# Patient Record
Sex: Female | Born: 1980 | Race: Black or African American | Hispanic: No | Marital: Single | Smoking: Current every day smoker
Health system: Southern US, Community
[De-identification: ages and names within clinical notes are randomized; demographics above are authoritative.]

## PROBLEM LIST (undated history)

## (undated) DIAGNOSIS — D219 Benign neoplasm of connective and other soft tissue, unspecified: Secondary | ICD-10-CM

## (undated) DIAGNOSIS — N939 Abnormal uterine and vaginal bleeding, unspecified: Secondary | ICD-10-CM

## (undated) DIAGNOSIS — IMO0002 Reserved for concepts with insufficient information to code with codable children: Secondary | ICD-10-CM

## (undated) DIAGNOSIS — Z87448 Personal history of other diseases of urinary system: Secondary | ICD-10-CM

## (undated) DIAGNOSIS — R87619 Unspecified abnormal cytological findings in specimens from cervix uteri: Secondary | ICD-10-CM

## (undated) DIAGNOSIS — J45909 Unspecified asthma, uncomplicated: Secondary | ICD-10-CM

## (undated) HISTORY — DX: Unspecified abnormal cytological findings in specimens from cervix uteri: R87.619

## (undated) HISTORY — DX: Personal history of other diseases of urinary system: Z87.448

## (undated) HISTORY — DX: Unspecified asthma, uncomplicated: J45.909

## (undated) HISTORY — DX: Reserved for concepts with insufficient information to code with codable children: IMO0002

---

## 1999-08-16 ENCOUNTER — Other Ambulatory Visit: Admission: RE | Admit: 1999-08-16 | Discharge: 1999-08-16 | Payer: Self-pay | Admitting: Obstetrics and Gynecology

## 1999-08-16 ENCOUNTER — Encounter (INDEPENDENT_AMBULATORY_CARE_PROVIDER_SITE_OTHER): Payer: Self-pay

## 1999-09-06 ENCOUNTER — Emergency Department (HOSPITAL_COMMUNITY): Admission: EM | Admit: 1999-09-06 | Discharge: 1999-09-06 | Payer: Self-pay | Admitting: Emergency Medicine

## 2000-09-27 ENCOUNTER — Encounter (INDEPENDENT_AMBULATORY_CARE_PROVIDER_SITE_OTHER): Payer: Self-pay

## 2000-09-27 ENCOUNTER — Other Ambulatory Visit: Admission: RE | Admit: 2000-09-27 | Discharge: 2000-09-27 | Payer: Self-pay | Admitting: Obstetrics

## 2000-11-26 ENCOUNTER — Encounter: Admission: RE | Admit: 2000-11-26 | Discharge: 2000-11-26 | Payer: Self-pay | Admitting: Obstetrics & Gynecology

## 2002-06-25 HISTORY — PX: CRYOTHERAPY: SHX1416

## 2002-06-25 HISTORY — PX: COLPOSCOPY: SHX161

## 2003-08-20 ENCOUNTER — Encounter: Admission: RE | Admit: 2003-08-20 | Discharge: 2003-08-20 | Payer: Self-pay | Admitting: Family Medicine

## 2003-12-14 ENCOUNTER — Encounter: Admission: RE | Admit: 2003-12-14 | Discharge: 2003-12-14 | Payer: Self-pay | Admitting: Obstetrics and Gynecology

## 2004-10-18 ENCOUNTER — Emergency Department (HOSPITAL_COMMUNITY): Admission: EM | Admit: 2004-10-18 | Discharge: 2004-10-18 | Payer: Self-pay | Admitting: Emergency Medicine

## 2005-07-17 ENCOUNTER — Other Ambulatory Visit: Admission: RE | Admit: 2005-07-17 | Discharge: 2005-07-17 | Payer: Self-pay | Admitting: Obstetrics & Gynecology

## 2006-02-24 IMAGING — CR DG HAND COMPLETE 3+V*R*
3 series · 3 of 3 positions shown · non-contrast
Comparison: none

CLINICAL DATA: Laceration.  
 3-VIEW RIGHT HAND:

[view not recorded (1 of 3)]
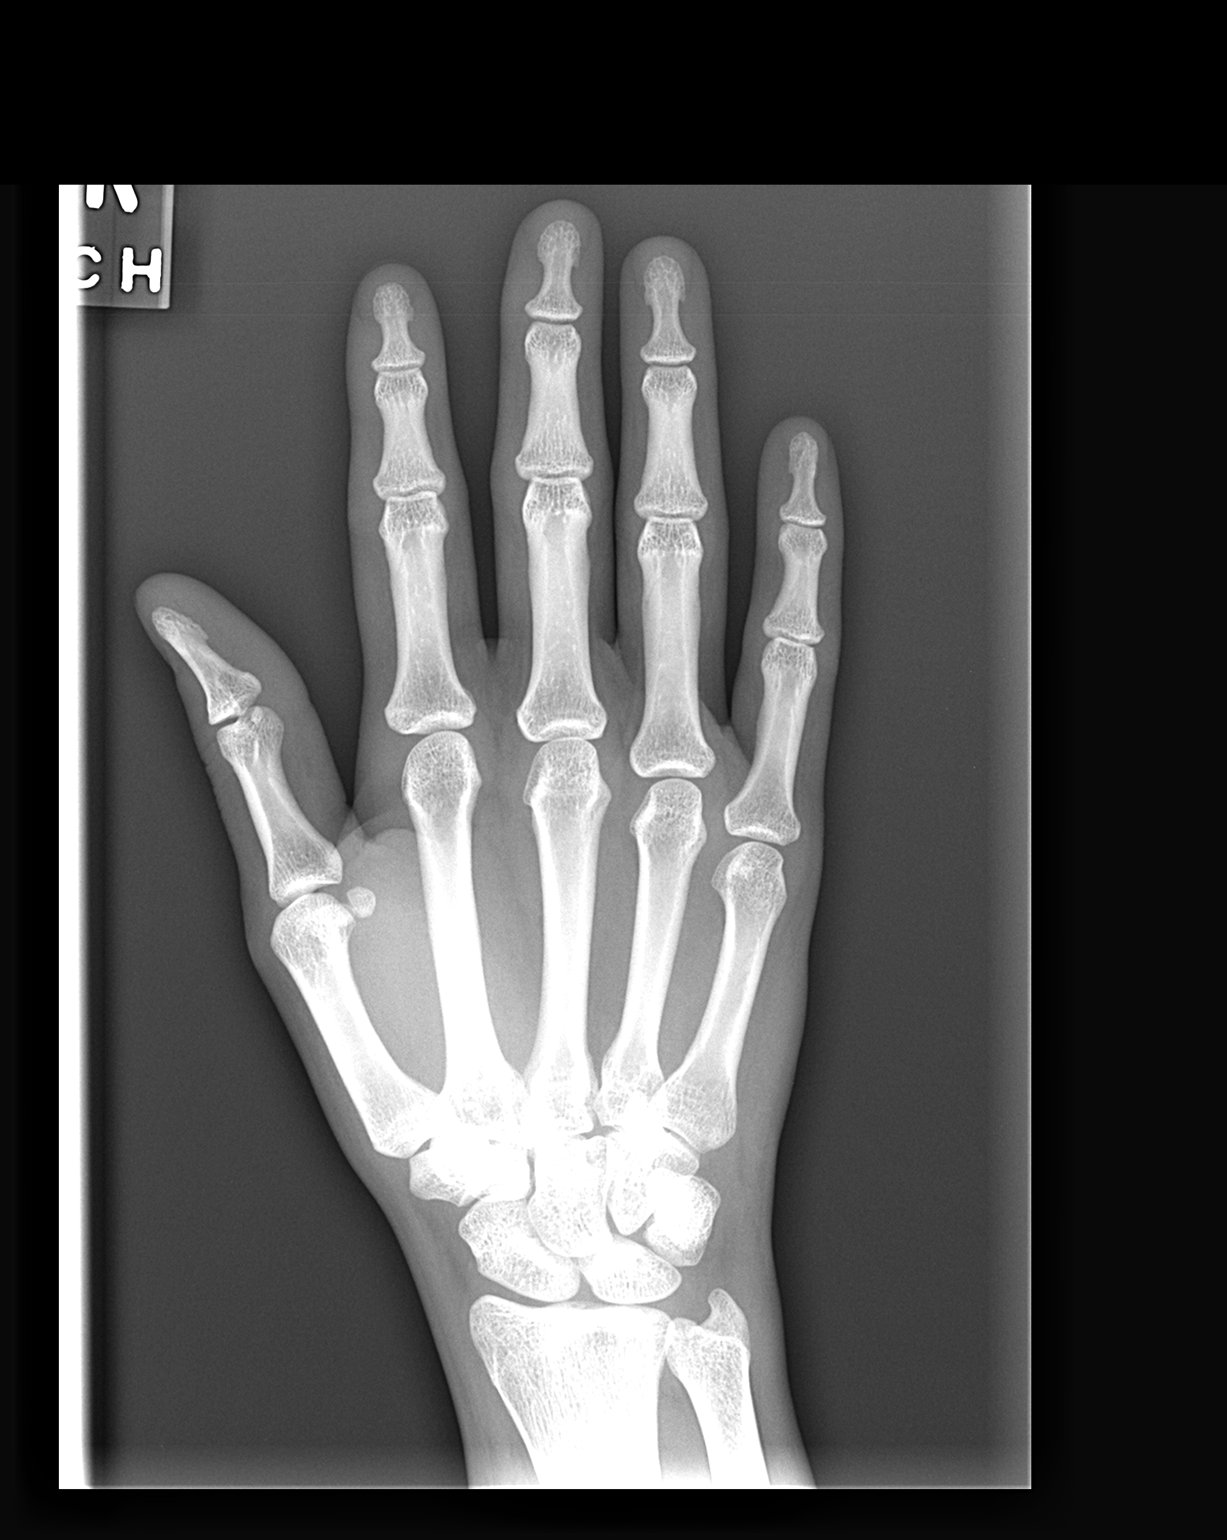

[view not recorded (2 of 3)]
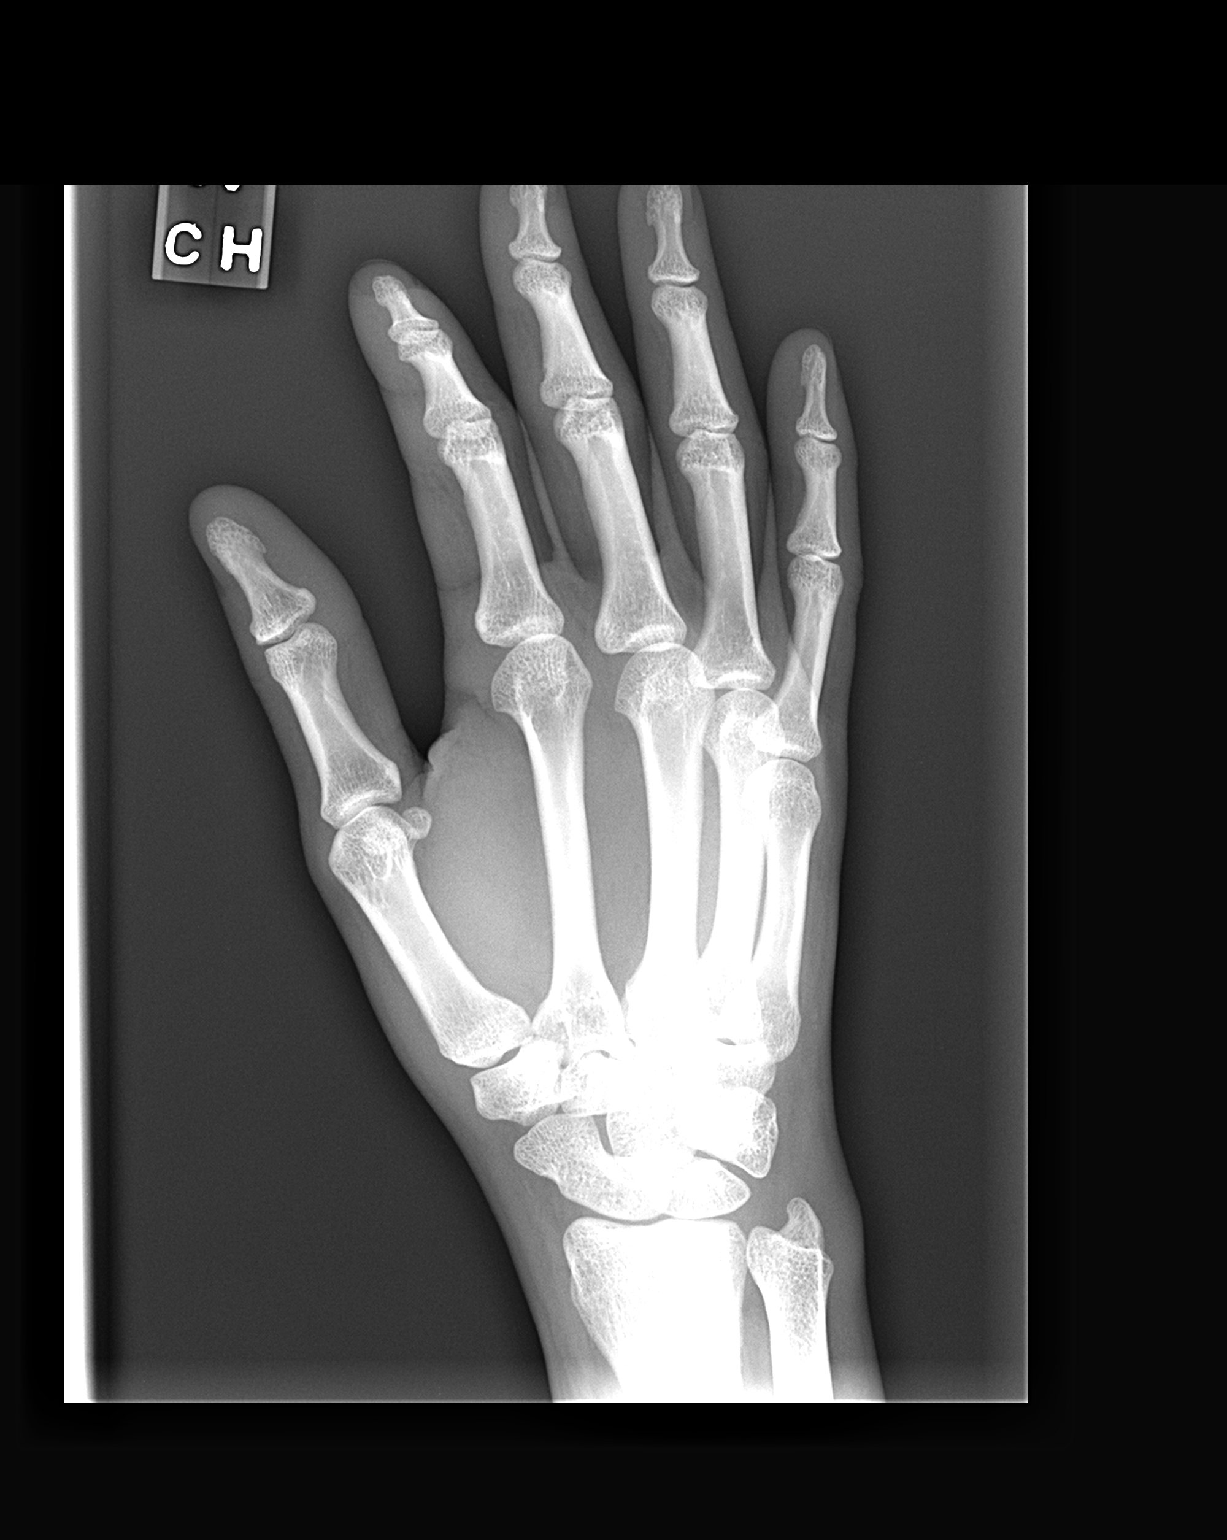

[view not recorded (3 of 3)]
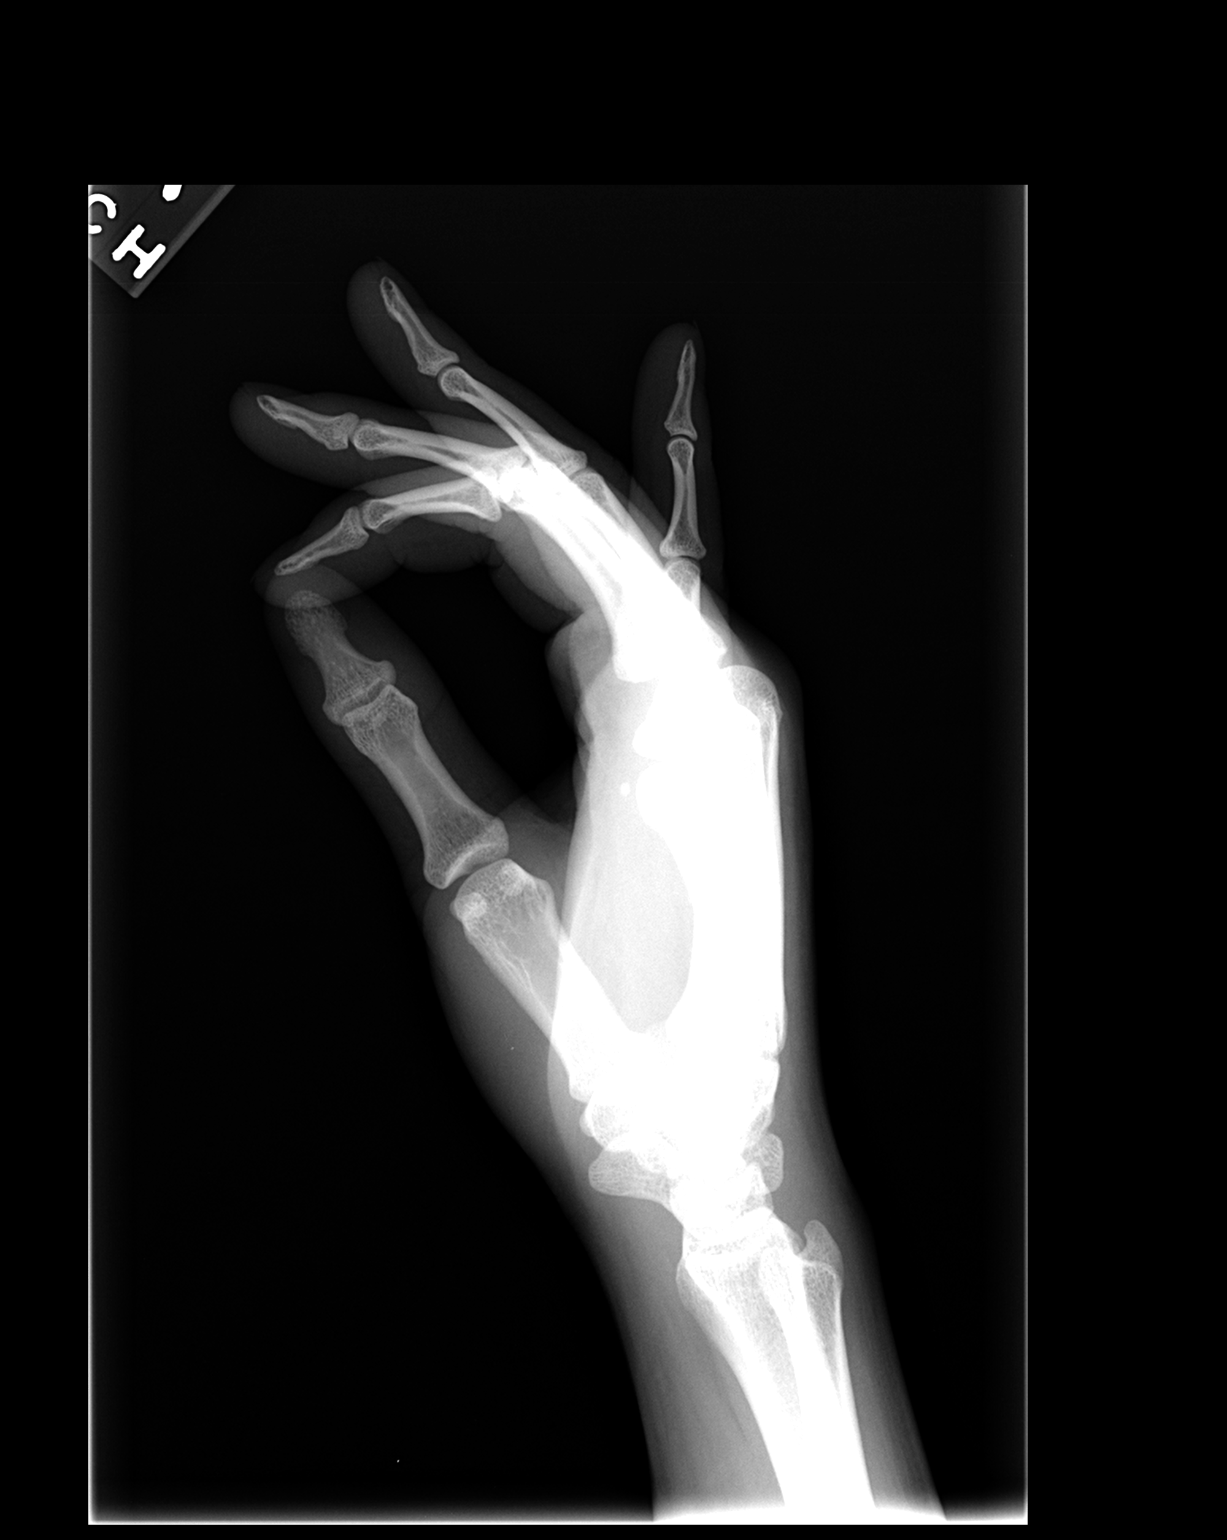

[3 of 3 positions shown; findings below may reference images not displayed]

FINDINGS: No acute bony or joint abnormality is identified.  No radiopaque foreign body is seen.
IMPRESSION: Negative study.

## 2006-10-09 ENCOUNTER — Other Ambulatory Visit: Admission: RE | Admit: 2006-10-09 | Discharge: 2006-10-09 | Payer: Self-pay | Admitting: Obstetrics & Gynecology

## 2007-11-03 ENCOUNTER — Other Ambulatory Visit: Admission: RE | Admit: 2007-11-03 | Discharge: 2007-11-03 | Payer: Self-pay | Admitting: Obstetrics and Gynecology

## 2013-01-01 ENCOUNTER — Encounter: Payer: Self-pay | Admitting: Certified Nurse Midwife

## 2013-01-02 ENCOUNTER — Ambulatory Visit: Payer: Self-pay | Admitting: Certified Nurse Midwife

## 2013-01-05 ENCOUNTER — Ambulatory Visit (INDEPENDENT_AMBULATORY_CARE_PROVIDER_SITE_OTHER): Payer: BC Managed Care – PPO | Admitting: Gynecology

## 2013-01-05 ENCOUNTER — Ambulatory Visit: Payer: Self-pay | Admitting: Gynecology

## 2013-01-05 ENCOUNTER — Encounter: Payer: Self-pay | Admitting: Gynecology

## 2013-01-05 VITALS — BP 120/60 | HR 74 | Resp 12 | Ht 64.5 in | Wt 159.0 lb

## 2013-01-05 DIAGNOSIS — Z0000 Encounter for general adult medical examination without abnormal findings: Secondary | ICD-10-CM

## 2013-01-05 DIAGNOSIS — Z01419 Encounter for gynecological examination (general) (routine) without abnormal findings: Secondary | ICD-10-CM

## 2013-01-05 DIAGNOSIS — Z124 Encounter for screening for malignant neoplasm of cervix: Secondary | ICD-10-CM

## 2013-01-05 DIAGNOSIS — Z113 Encounter for screening for infections with a predominantly sexual mode of transmission: Secondary | ICD-10-CM

## 2013-01-05 LAB — POCT URINALYSIS DIPSTICK
Blood, UA: 1
pH, UA: 5

## 2013-01-05 NOTE — Progress Notes (Signed)
32 y.o. legallly not married [place] American female   [id number] here for annual exam. Pt is currently sexually active. Currently sexually active for 48m, condoms most times.    Patient's last menstrual period was 12/27/2012.          Sexually active: yes  The current method of family planning is condoms.    Exercising: yes  jogging/swimming 3x/wk Last pap: 12/12/11-  Normal Alcohol: 1 glass of wine qd Tobacco: no BSE: not monthly, q 3 mos  Urine: Blood 1; Leuks 2   Health Maintenance  Topic Date Due  . Pap Smear  07/26/1997  . Influenza Vaccine  02/23/2013  . Tetanus/tdap  06/25/2016    Family History  Problem Relation Age of Onset  . Thyroid disease Father     ?  Marland Kitchen Hypertension Mother   . Hypertension Brother     There are no active problems to display for this patient.   Past Medical History  Diagnosis Date  . Abnormal Pap smear   . H/O hematuria     negative micros times 3  . Childhood asthma     Past Surgical History  Procedure Laterality Date  . Cryotherapy  2004  . Colposcopy  2004    mild dysplasia    Allergies: Amoxicillin  Current Outpatient Prescriptions  Medication Sig Dispense Refill  . Prenatal Vit-Fe Sulfate-FA (PRENATAL VITAMIN PO) Take by mouth daily.       No current facility-administered medications for this visit.    ROS: Pertinent items are noted in HPI.  Exam:    Ht 5' 4.5" (1.638 m)  Wt 159 lb (72.122 kg)  BMI 26.88 kg/m2  LMP 12/27/2012 Weight change: @WEIGHTCHANGE @ Last 3 height recordings:  Ht Readings from Last 3 Encounters:  01/05/13 5' 4.5" (1.638 m)   General appearance: alert, cooperative and appears stated age Head: Normocephalic, without obvious abnormality, atraumatic Neck: no adenopathy, no carotid bruit, no JVD, supple, symmetrical, trachea midline and thyroid not enlarged, symmetric, no tenderness/mass/nodules Lungs: clear to auscultation bilaterally Breasts: normal appearance, no masses or tenderness Heart:  regular rate and rhythm, S1, S2 normal, no murmur, click, rub or gallop Abdomen: soft, non-tender; bowel sounds normal; no masses,  no organomegaly Extremities: extremities normal, atraumatic, no cyanosis or edema Skin: Skin color, texture, turgor normal. No rashes or lesions, sebaceous cyst on thigh on left  Lymph nodes: Cervical, supraclavicular, and axillary nodes normal. no inguinal nodes palpated Neurologic: Grossly normal   Pelvic: External genitalia:  no lesions              Urethra: normal appearing urethra with no masses, tenderness or lesions              Bartholins and Skenes: normal                 Vagina: normal appearing vagina with normal color and discharge, no lesions              Cervix: normal appearance              Pap taken: yes        Bimanual Exam:  Uterus:  uterus is normal size, shape, consistency and nontender                                      Adnexa:    normal adnexa in size, nontender and no masses  Rectovaginal: Confirms                                      Anus:  normal sphincter tone, no lesions  A: well woman Contraceptive management sebaceous cyst     P: pap smear with HPV Happy with condoms Warm compress return annually or prn   An After Visit Summary was printed and given to the patient.

## 2013-01-08 LAB — IPS N GONORRHOEA AND CHLAMYDIA BY PCR

## 2013-01-09 LAB — IPS PAP TEST WITH HPV

## 2013-01-15 ENCOUNTER — Telehealth: Payer: Self-pay | Admitting: *Deleted

## 2013-01-15 DIAGNOSIS — IMO0002 Reserved for concepts with insufficient information to code with codable children: Secondary | ICD-10-CM

## 2013-01-15 NOTE — Telephone Encounter (Signed)
Patient notified of dr Farrel Gobble recommendation for colpo.  Briefly explained procedure.  Condoms for contraception.  Next menses due early August. Colpo scheduled for 02-02-13. Instructed to take Motrin prior to preocedure with food.

## 2013-01-15 NOTE — Telephone Encounter (Signed)
Message copied by Alisa Graff on Thu Jan 15, 2013  2:17 PM ------      Message from: Douglass Rivers      Created: Sun Jan 11, 2013 11:26 AM       Will need colpo ------

## 2013-01-15 NOTE — Telephone Encounter (Signed)
Message copied by Alisa Graff on Thu Jan 15, 2013  2:26 PM ------      Message from: Douglass Rivers      Created: Sun Jan 11, 2013 11:26 AM       Will need colpo ------

## 2013-01-16 ENCOUNTER — Telehealth: Payer: Self-pay | Admitting: Gynecology

## 2013-01-16 NOTE — Telephone Encounter (Signed)
Call to patient to advise copay due for colpo.

## 2013-02-02 ENCOUNTER — Encounter: Payer: Self-pay | Admitting: Gynecology

## 2013-02-02 ENCOUNTER — Ambulatory Visit (INDEPENDENT_AMBULATORY_CARE_PROVIDER_SITE_OTHER): Payer: BC Managed Care – PPO | Admitting: Gynecology

## 2013-02-02 VITALS — BP 118/74 | HR 62 | Resp 14 | Wt 158.0 lb

## 2013-02-02 DIAGNOSIS — IMO0002 Reserved for concepts with insufficient information to code with codable children: Secondary | ICD-10-CM

## 2013-02-02 DIAGNOSIS — R6889 Other general symptoms and signs: Secondary | ICD-10-CM

## 2013-02-02 DIAGNOSIS — B977 Papillomavirus as the cause of diseases classified elsewhere: Secondary | ICD-10-CM

## 2013-02-02 NOTE — Progress Notes (Signed)
32 y.o. Single  African American  female  OB History   Grav Para Term Preterm Abortions TAB SAB Ect Mult Living   0 0 0 0 0 0 0 0 0 0      here for colposcopy. 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  Contraception:  condoms   Blood pressure 118/74, pulse 62, resp. rate 14, weight 158 lb (71.668 kg), last menstrual period 12/27/2012.  Procedure explained and patient's questions were invited and answered.  Consent form signed.    Role of HPV in genesis of SIL discussed with patient, and questions answered.   Speculum inserted atraumatically and cervix visualized.  3% acetic acid applied.  Cervix examined using 3.75 and 7.5, 15   X magnification and green filter.    Gross appearance:scarred consistant with cryosurgery  Squamocolumnar junction seen in entirety: no   no visible lesions and no abnormal vasculature  cervix swabbed with Lugol's solution, endocervical curettage performed after mild dilation and specimen labelled and sent to pathology  Extent of lesion entirely seen: no  Patient tolerated procedure well.   Plan:  Will base further treatment on Pathology findings.  Post biopsy instructions and AVS given to patient.

## 2013-02-02 NOTE — Patient Instructions (Signed)
Colposcopy Post Procedure Instructions  * You may take Ibuprofen, Aleve, or Tylenol for cramping as if needed.  * If Monsel's solution is used, you may have a slight black discharge for a day or two.   * Light bleeding is normal. If bleeding is heavier than your period, please call our office.  * Refrain from putting anything in your vagina until the bleeding or discharge stops (usually 2 or 3 days).  * You will be notified within one week of your biopsy results, or we will discuss your results at your follow-up appointment if needed.  

## 2013-02-11 ENCOUNTER — Telehealth: Payer: Self-pay | Admitting: *Deleted

## 2013-02-11 NOTE — Telephone Encounter (Signed)
Message copied by Lorraine Lax on Wed Feb 11, 2013 11:06 AM ------      Message from: Douglass Rivers      Created: Wed Feb 11, 2013 10:34 AM       Ok, recall 8, no dysplasia ------

## 2013-02-11 NOTE — Telephone Encounter (Signed)
Patient notified see labs 

## 2013-02-11 NOTE — Telephone Encounter (Signed)
Left Message To Call Back  

## 2013-04-07 ENCOUNTER — Encounter: Payer: Self-pay | Admitting: Gynecology

## 2013-10-05 LAB — OB RESULTS CONSOLE ABO/RH: RH Type: POSITIVE

## 2013-10-05 LAB — OB RESULTS CONSOLE ANTIBODY SCREEN: Antibody Screen: NEGATIVE

## 2013-10-05 LAB — OB RESULTS CONSOLE RUBELLA ANTIBODY, IGM: Rubella: IMMUNE

## 2013-10-05 LAB — OB RESULTS CONSOLE GC/CHLAMYDIA
CHLAMYDIA, DNA PROBE: NEGATIVE
Gonorrhea: NEGATIVE

## 2013-10-05 LAB — OB RESULTS CONSOLE HIV ANTIBODY (ROUTINE TESTING): HIV: NONREACTIVE

## 2013-10-05 LAB — OB RESULTS CONSOLE RPR: RPR: NONREACTIVE

## 2013-10-05 LAB — OB RESULTS CONSOLE HEPATITIS B SURFACE ANTIGEN: Hepatitis B Surface Ag: NEGATIVE

## 2014-01-07 ENCOUNTER — Ambulatory Visit: Payer: BC Managed Care – PPO | Admitting: Gynecology

## 2014-01-08 ENCOUNTER — Ambulatory Visit: Payer: BC Managed Care – PPO | Admitting: Gynecology

## 2014-04-22 LAB — OB RESULTS CONSOLE GBS: GBS: NEGATIVE

## 2014-05-08 ENCOUNTER — Inpatient Hospital Stay (HOSPITAL_COMMUNITY)
Admission: AD | Admit: 2014-05-08 | Discharge: 2014-05-12 | DRG: 766 | Disposition: A | Discharge status: Home / Self Care | Payer: BC Managed Care – PPO | Source: Ambulatory Visit | Attending: Obstetrics and Gynecology | Admitting: Obstetrics and Gynecology

## 2014-05-08 ENCOUNTER — Encounter (HOSPITAL_COMMUNITY): Payer: Self-pay | Admitting: *Deleted

## 2014-05-08 DIAGNOSIS — Z98891 History of uterine scar from previous surgery: Secondary | ICD-10-CM

## 2014-05-08 DIAGNOSIS — Z3A39 39 weeks gestation of pregnancy: Secondary | ICD-10-CM | POA: Diagnosis present

## 2014-05-08 LAB — CBC
HCT: 35 % — ABNORMAL LOW (ref 36.0–46.0)
HEMOGLOBIN: 12 g/dL (ref 12.0–15.0)
MCH: 30.8 pg (ref 26.0–34.0)
MCHC: 34.3 g/dL (ref 30.0–36.0)
MCV: 90 fL (ref 78.0–100.0)
PLATELETS: 260 10*3/uL (ref 150–400)
RBC: 3.89 MIL/uL (ref 3.87–5.11)
RDW: 13.6 % (ref 11.5–15.5)
WBC: 9.1 10*3/uL (ref 4.0–10.5)

## 2014-05-08 LAB — TYPE AND SCREEN
ABO/RH(D): A POS
Antibody Screen: NEGATIVE

## 2014-05-08 MED ORDER — ONDANSETRON HCL 4 MG/2ML IJ SOLN: 4.0000 mg | Freq: Four times a day (QID) | INTRAMUSCULAR | Status: DC | PRN

## 2014-05-08 MED ORDER — OXYCODONE-ACETAMINOPHEN 5-325 MG PO TABS: 1.0000 | ORAL_TABLET | ORAL | Status: DC | PRN

## 2014-05-08 MED ORDER — LIDOCAINE HCL (PF) 1 % IJ SOLN: 30.0000 mL | INTRAMUSCULAR | Status: DC | PRN

## 2014-05-08 MED ORDER — LACTATED RINGERS IV SOLN
500.0000 mL | INTRAVENOUS | Status: DC | PRN
  Administered 2014-05-09 (×3): 500 mL via INTRAVENOUS

## 2014-05-08 MED ORDER — CITRIC ACID-SODIUM CITRATE 334-500 MG/5ML PO SOLN
30.0000 mL | ORAL | Status: DC | PRN
Start: 2014-05-08 — End: 2014-05-09
  Administered 2014-05-09: 30 mL via ORAL
  Filled 2014-05-08: qty 15

## 2014-05-08 MED ORDER — LACTATED RINGERS IV SOLN
INTRAVENOUS | Status: DC
  Administered 2014-05-08 – 2014-05-09 (×5): via INTRAVENOUS

## 2014-05-08 MED ORDER — OXYCODONE-ACETAMINOPHEN 5-325 MG PO TABS: 2.0000 | ORAL_TABLET | ORAL | Status: DC | PRN

## 2014-05-08 MED ORDER — FLEET ENEMA 7-19 GM/118ML RE ENEM: 1.0000 | ENEMA | RECTAL | Status: DC | PRN

## 2014-05-08 MED ORDER — ACETAMINOPHEN 325 MG PO TABS: 650.0000 mg | ORAL_TABLET | ORAL | Status: DC | PRN

## 2014-05-08 MED ORDER — OXYTOCIN 40 UNITS IN LACTATED RINGERS INFUSION - SIMPLE MED
62.5000 mL/h | INTRAVENOUS | Status: DC
  Filled 2014-05-08: qty 1000

## 2014-05-08 MED ORDER — OXYTOCIN BOLUS FROM INFUSION: 500.0000 mL | INTRAVENOUS | Status: DC

## 2014-05-08 NOTE — MAU Note (Signed)
Pt water broke around 1615, pink blood tinged fluid.  Come irregular contractions.

## 2014-05-08 NOTE — MAU Note (Signed)
Prolonged deceleration noted, nurses at bedside, positions changed and O2 administered, IV bolus started.  Decel recovered, notified Dr. Radene Knee of strip and interventions.

## 2014-05-08 NOTE — Plan of Care (Signed)
Problem: Consults Goal: Automotive engineer Patient Education (See Patient Education module for education specifics.)  Outcome: Completed/Met Date Met:  05/08/14 Goal: Birthing Suites Patient Information Press F2 to bring up selections list  Outcome: Completed/Met Date Met:  05/08/14  Pt 37-[redacted] weeks EGA Goal: Orientation to unit: Plan of Care Outcome: Completed/Met Date Met:  05/08/14 Goal: Orientation to unit: Room Outcome: Completed/Met Date Met:  05/08/14 Goal: Orientation to unit: [place] (smoking, visitation, chaplain services, helpline)  Outcome: Completed/Met Date Met:  05/08/14 Goal: Orientation to unit: Other (Specify with a note) Outcome: Not Applicable Date Met:  44/62/86  Problem: Phase I Progression Outcomes Goal: Assess per MD/Nurse,Routine-VS,FHR,UC,Head to Toe assess Outcome: Completed/Met Date Met:  05/08/14 Goal: OOB as tolerated unless otherwise ordered Outcome: Progressing Goal: Tolerating diet Outcome: Progressing Goal: Medications/IV Fluids N/A Outcome: Progressing Goal: FHR checked 5 minutes after meds (ROM) Rupture of Membranes Outcome: Not Applicable Date Met:  38/17/71

## 2014-05-09 ENCOUNTER — Encounter (HOSPITAL_COMMUNITY): Payer: Self-pay | Admitting: Anesthesiology

## 2014-05-09 ENCOUNTER — Inpatient Hospital Stay (HOSPITAL_COMMUNITY): Payer: BC Managed Care – PPO | Admitting: Anesthesiology

## 2014-05-09 ENCOUNTER — Encounter (HOSPITAL_COMMUNITY)
Admission: AD | Disposition: A | Discharge status: Home / Self Care | Payer: Self-pay | Source: Ambulatory Visit | Attending: Obstetrics and Gynecology

## 2014-05-09 DIAGNOSIS — Z98891 History of uterine scar from previous surgery: Secondary | ICD-10-CM

## 2014-05-09 LAB — RPR

## 2014-05-09 LAB — ABO/RH: ABO/RH(D): A POS

## 2014-05-09 SURGERY — Surgical Case
Anesthesia: Epidural | Site: Abdomen

## 2014-05-09 MED ORDER — SCOPOLAMINE 1 MG/3DAYS TD PT72
MEDICATED_PATCH | TRANSDERMAL | Status: DC | PRN
  Administered 2014-05-09: 1 via TRANSDERMAL

## 2014-05-09 MED ORDER — LACTATED RINGERS IV SOLN
INTRAVENOUS | Status: DC | PRN
  Administered 2014-05-09 (×2): via INTRAVENOUS

## 2014-05-09 MED ORDER — KETOROLAC TROMETHAMINE 30 MG/ML IJ SOLN: 30.0000 mg | Freq: Four times a day (QID) | INTRAMUSCULAR | Status: AC | PRN

## 2014-05-09 MED ORDER — FENTANYL 2.5 MCG/ML BUPIVACAINE 1/10 % EPIDURAL INFUSION (WH - ANES)
INTRAMUSCULAR | Status: DC | PRN
  Administered 2014-05-09: 14 mL/h via EPIDURAL

## 2014-05-09 MED ORDER — SENNOSIDES-DOCUSATE SODIUM 8.6-50 MG PO TABS
2.0000 | ORAL_TABLET | ORAL | Status: DC
  Administered 2014-05-10 – 2014-05-11 (×3): 2 via ORAL
  Filled 2014-05-09 (×3): qty 2

## 2014-05-09 MED ORDER — OXYTOCIN 10 UNIT/ML IJ SOLN
INTRAMUSCULAR | Status: AC
  Filled 2014-05-09: qty 4

## 2014-05-09 MED ORDER — SCOPOLAMINE 1 MG/3DAYS TD PT72
1.0000 | MEDICATED_PATCH | Freq: Once | TRANSDERMAL | Status: DC
  Filled 2014-05-09: qty 1

## 2014-05-09 MED ORDER — FLEET ENEMA 7-19 GM/118ML RE ENEM: 1.0000 | ENEMA | Freq: Every day | RECTAL | Status: DC | PRN

## 2014-05-09 MED ORDER — DEXTROSE 5 % IV SOLN
1.0000 ug/kg/h | INTRAVENOUS | Status: DC | PRN
  Filled 2014-05-09: qty 2

## 2014-05-09 MED ORDER — LACTATED RINGERS IV SOLN
500.0000 mL | Freq: Once | INTRAVENOUS | Status: AC
  Administered 2014-05-09: 500 mL via INTRAVENOUS

## 2014-05-09 MED ORDER — IBUPROFEN 600 MG PO TABS: 600.0000 mg | ORAL_TABLET | Freq: Four times a day (QID) | ORAL | Status: DC | PRN

## 2014-05-09 MED ORDER — NALOXONE HCL 0.4 MG/ML IJ SOLN: 0.4000 mg | INTRAMUSCULAR | Status: DC | PRN

## 2014-05-09 MED ORDER — KETOROLAC TROMETHAMINE 30 MG/ML IJ SOLN
30.0000 mg | Freq: Four times a day (QID) | INTRAMUSCULAR | Status: AC | PRN
  Administered 2014-05-09: 30 mg via INTRAVENOUS

## 2014-05-09 MED ORDER — PHENYLEPHRINE 40 MCG/ML (10ML) SYRINGE FOR IV PUSH (FOR BLOOD PRESSURE SUPPORT)
80.0000 ug | PREFILLED_SYRINGE | INTRAVENOUS | Status: AC | PRN
  Administered 2014-05-09: 80 ug via INTRAVENOUS
  Administered 2014-05-09: 40 ug via INTRAVENOUS
  Administered 2014-05-09: 80 ug via INTRAVENOUS
  Filled 2014-05-09: qty 10

## 2014-05-09 MED ORDER — LANOLIN HYDROUS EX OINT: 1.0000 "application " | TOPICAL_OINTMENT | CUTANEOUS | Status: DC | PRN

## 2014-05-09 MED ORDER — LACTATED RINGERS IV SOLN
INTRAVENOUS | Status: DC | PRN
  Administered 2014-05-09: 12:00:00 via INTRAVENOUS

## 2014-05-09 MED ORDER — EPHEDRINE 5 MG/ML INJ: 10.0000 mg | INTRAVENOUS | Status: DC | PRN

## 2014-05-09 MED ORDER — SCOPOLAMINE 1 MG/3DAYS TD PT72
MEDICATED_PATCH | TRANSDERMAL | Status: AC
  Filled 2014-05-09: qty 1

## 2014-05-09 MED ORDER — MENTHOL 3 MG MT LOZG: 1.0000 | LOZENGE | OROMUCOSAL | Status: DC | PRN

## 2014-05-09 MED ORDER — BUTORPHANOL TARTRATE 1 MG/ML IJ SOLN
1.0000 mg | Freq: Once | INTRAMUSCULAR | Status: AC
  Administered 2014-05-09: 1 mg via INTRAVENOUS
  Filled 2014-05-09: qty 1

## 2014-05-09 MED ORDER — ONDANSETRON HCL 4 MG PO TABS: 4.0000 mg | ORAL_TABLET | ORAL | Status: DC | PRN

## 2014-05-09 MED ORDER — NALBUPHINE HCL 10 MG/ML IJ SOLN: 5.0000 mg | INTRAMUSCULAR | Status: DC | PRN

## 2014-05-09 MED ORDER — BISACODYL 10 MG RE SUPP: 10.0000 mg | Freq: Every day | RECTAL | Status: DC | PRN

## 2014-05-09 MED ORDER — KETOROLAC TROMETHAMINE 30 MG/ML IJ SOLN
INTRAMUSCULAR | Status: AC
  Filled 2014-05-09: qty 1

## 2014-05-09 MED ORDER — ZOLPIDEM TARTRATE 5 MG PO TABS: 5.0000 mg | ORAL_TABLET | Freq: Every evening | ORAL | Status: DC | PRN

## 2014-05-09 MED ORDER — OXYCODONE-ACETAMINOPHEN 5-325 MG PO TABS: 2.0000 | ORAL_TABLET | ORAL | Status: DC | PRN

## 2014-05-09 MED ORDER — DIPHENHYDRAMINE HCL 50 MG/ML IJ SOLN: 12.5000 mg | INTRAMUSCULAR | Status: DC | PRN

## 2014-05-09 MED ORDER — ONDANSETRON HCL 4 MG/2ML IJ SOLN: 4.0000 mg | INTRAMUSCULAR | Status: DC | PRN

## 2014-05-09 MED ORDER — OXYTOCIN 40 UNITS IN LACTATED RINGERS INFUSION - SIMPLE MED
1.0000 m[IU]/min | INTRAVENOUS | Status: DC
  Administered 2014-05-09: 1 m[IU]/min via INTRAVENOUS

## 2014-05-09 MED ORDER — DIPHENHYDRAMINE HCL 50 MG/ML IJ SOLN
12.5000 mg | INTRAMUSCULAR | Status: DC | PRN
  Administered 2014-05-09: 12.5 mg via INTRAVENOUS
  Filled 2014-05-09: qty 1

## 2014-05-09 MED ORDER — LACTATED RINGERS IV SOLN
INTRAVENOUS | Status: DC
  Administered 2014-05-10: 02:00:00 via INTRAVENOUS

## 2014-05-09 MED ORDER — KETOROLAC TROMETHAMINE 30 MG/ML IJ SOLN: 15.0000 mg | Freq: Once | INTRAMUSCULAR | Status: DC | PRN

## 2014-05-09 MED ORDER — SIMETHICONE 80 MG PO CHEW: 80.0000 mg | CHEWABLE_TABLET | ORAL | Status: DC | PRN

## 2014-05-09 MED ORDER — SODIUM CHLORIDE 0.9 % IJ SOLN: 3.0000 mL | Freq: Two times a day (BID) | INTRAMUSCULAR | Status: DC

## 2014-05-09 MED ORDER — LIDOCAINE HCL (PF) 1 % IJ SOLN
INTRAMUSCULAR | Status: DC | PRN
  Administered 2014-05-09 (×2): 8 mL

## 2014-05-09 MED ORDER — LIDOCAINE-EPINEPHRINE (PF) 2 %-1:200000 IJ SOLN
INTRAMUSCULAR | Status: AC
  Filled 2014-05-09: qty 20

## 2014-05-09 MED ORDER — SIMETHICONE 80 MG PO CHEW
80.0000 mg | CHEWABLE_TABLET | ORAL | Status: DC
  Administered 2014-05-10 – 2014-05-11 (×3): 80 mg via ORAL
  Filled 2014-05-09 (×4): qty 1

## 2014-05-09 MED ORDER — MEPERIDINE HCL 25 MG/ML IJ SOLN
INTRAMUSCULAR | Status: AC
  Filled 2014-05-09: qty 1

## 2014-05-09 MED ORDER — NALBUPHINE HCL 10 MG/ML IJ SOLN: 5.0000 mg | Freq: Once | INTRAMUSCULAR | Status: AC | PRN

## 2014-05-09 MED ORDER — WITCH HAZEL-GLYCERIN EX PADS: 1.0000 "application " | MEDICATED_PAD | CUTANEOUS | Status: DC | PRN

## 2014-05-09 MED ORDER — SODIUM CHLORIDE 0.9 % IJ SOLN: 3.0000 mL | INTRAMUSCULAR | Status: DC | PRN

## 2014-05-09 MED ORDER — ONDANSETRON HCL 4 MG/2ML IJ SOLN
INTRAMUSCULAR | Status: AC
  Filled 2014-05-09: qty 2

## 2014-05-09 MED ORDER — PRENATAL MULTIVITAMIN CH
1.0000 | ORAL_TABLET | Freq: Every day | ORAL | Status: DC
  Administered 2014-05-10 – 2014-05-11 (×2): 1 via ORAL
  Filled 2014-05-09 (×3): qty 1

## 2014-05-09 MED ORDER — OXYTOCIN 10 UNIT/ML IJ SOLN
40.0000 [IU] | INTRAVENOUS | Status: DC | PRN
  Administered 2014-05-09: 40 [IU] via INTRAVENOUS

## 2014-05-09 MED ORDER — CLINDAMYCIN PHOSPHATE 900 MG/50ML IV SOLN
900.0000 mg | Freq: Once | INTRAVENOUS | Status: AC
  Administered 2014-05-09: 900 mg via INTRAVENOUS
  Filled 2014-05-09: qty 50

## 2014-05-09 MED ORDER — DIPHENHYDRAMINE HCL 25 MG PO CAPS
25.0000 mg | ORAL_CAPSULE | Freq: Four times a day (QID) | ORAL | Status: DC | PRN
  Filled 2014-05-09: qty 1

## 2014-05-09 MED ORDER — PHENYLEPHRINE 40 MCG/ML (10ML) SYRINGE FOR IV PUSH (FOR BLOOD PRESSURE SUPPORT): 80.0000 ug | PREFILLED_SYRINGE | INTRAVENOUS | Status: DC | PRN

## 2014-05-09 MED ORDER — DIBUCAINE 1 % RE OINT: 1.0000 "application " | TOPICAL_OINTMENT | RECTAL | Status: DC | PRN

## 2014-05-09 MED ORDER — ONDANSETRON HCL 4 MG/2ML IJ SOLN
INTRAMUSCULAR | Status: DC | PRN
  Administered 2014-05-09: 4 mg via INTRAVENOUS

## 2014-05-09 MED ORDER — DIPHENHYDRAMINE HCL 25 MG PO CAPS
25.0000 mg | ORAL_CAPSULE | ORAL | Status: DC | PRN
  Administered 2014-05-09: 25 mg via ORAL
  Filled 2014-05-09: qty 1

## 2014-05-09 MED ORDER — MEPERIDINE HCL 25 MG/ML IJ SOLN
6.2500 mg | INTRAMUSCULAR | Status: DC | PRN
  Administered 2014-05-09 (×2): 6.25 mg via INTRAVENOUS

## 2014-05-09 MED ORDER — IBUPROFEN 600 MG PO TABS
600.0000 mg | ORAL_TABLET | Freq: Four times a day (QID) | ORAL | Status: DC
  Administered 2014-05-09 – 2014-05-12 (×11): 600 mg via ORAL
  Filled 2014-05-09 (×11): qty 1

## 2014-05-09 MED ORDER — FENTANYL 2.5 MCG/ML BUPIVACAINE 1/10 % EPIDURAL INFUSION (WH - ANES)
14.0000 mL/h | INTRAMUSCULAR | Status: DC | PRN
  Filled 2014-05-09: qty 125

## 2014-05-09 MED ORDER — SODIUM CHLORIDE 0.9 % IV SOLN: 250.0000 mL | INTRAVENOUS | Status: DC

## 2014-05-09 MED ORDER — PHENYLEPHRINE 40 MCG/ML (10ML) SYRINGE FOR IV PUSH (FOR BLOOD PRESSURE SUPPORT)
PREFILLED_SYRINGE | INTRAVENOUS | Status: AC
  Filled 2014-05-09: qty 5

## 2014-05-09 MED ORDER — MORPHINE SULFATE 0.5 MG/ML IJ SOLN
INTRAMUSCULAR | Status: AC
  Filled 2014-05-09: qty 10

## 2014-05-09 MED ORDER — MORPHINE SULFATE (PF) 0.5 MG/ML IJ SOLN
INTRAMUSCULAR | Status: DC | PRN
  Administered 2014-05-09: 3 mg via EPIDURAL

## 2014-05-09 MED ORDER — ONDANSETRON HCL 4 MG/2ML IJ SOLN: 4.0000 mg | Freq: Three times a day (TID) | INTRAMUSCULAR | Status: DC | PRN

## 2014-05-09 MED ORDER — TETANUS-DIPHTH-ACELL PERTUSSIS 5-2.5-18.5 LF-MCG/0.5 IM SUSP: 0.5000 mL | Freq: Once | INTRAMUSCULAR | Status: DC

## 2014-05-09 MED ORDER — 0.9 % SODIUM CHLORIDE (POUR BTL) OPTIME
TOPICAL | Status: DC | PRN
  Administered 2014-05-09: 1000 mL

## 2014-05-09 MED ORDER — SIMETHICONE 80 MG PO CHEW
80.0000 mg | CHEWABLE_TABLET | Freq: Three times a day (TID) | ORAL | Status: DC
  Administered 2014-05-09 – 2014-05-12 (×6): 80 mg via ORAL
  Filled 2014-05-09 (×7): qty 1

## 2014-05-09 MED ORDER — OXYTOCIN 40 UNITS IN LACTATED RINGERS INFUSION - SIMPLE MED: 62.5000 mL/h | INTRAVENOUS | Status: AC

## 2014-05-09 MED ORDER — PROMETHAZINE HCL 25 MG/ML IJ SOLN: 6.2500 mg | INTRAMUSCULAR | Status: DC | PRN

## 2014-05-09 MED ORDER — HYDROMORPHONE HCL 1 MG/ML IJ SOLN
0.2500 mg | INTRAMUSCULAR | Status: DC | PRN
Start: 2014-05-09 — End: 2014-05-09

## 2014-05-09 MED ORDER — MEPERIDINE HCL 25 MG/ML IJ SOLN: 6.2500 mg | INTRAMUSCULAR | Status: DC | PRN

## 2014-05-09 MED ORDER — PHENYLEPHRINE HCL 10 MG/ML IJ SOLN
INTRAMUSCULAR | Status: DC | PRN
  Administered 2014-05-09 (×4): 40 ug via INTRAVENOUS

## 2014-05-09 MED ORDER — LIDOCAINE-EPINEPHRINE (PF) 2 %-1:200000 IJ SOLN
INTRAMUSCULAR | Status: DC | PRN
  Administered 2014-05-09: 3 mL via EPIDURAL
  Administered 2014-05-09: 7 mL via EPIDURAL

## 2014-05-09 MED ORDER — PROMETHAZINE HCL 25 MG/ML IJ SOLN
12.5000 mg | Freq: Once | INTRAMUSCULAR | Status: AC
  Administered 2014-05-09: 12.5 mg via INTRAVENOUS
  Filled 2014-05-09: qty 1

## 2014-05-09 MED ORDER — TERBUTALINE SULFATE 1 MG/ML IJ SOLN
0.2500 mg | Freq: Once | INTRAMUSCULAR | Status: DC | PRN
  Filled 2014-05-09: qty 1

## 2014-05-09 MED ORDER — OXYCODONE-ACETAMINOPHEN 5-325 MG PO TABS: 1.0000 | ORAL_TABLET | ORAL | Status: DC | PRN

## 2014-05-09 SURGICAL SUPPLY — 30 items
CLAMP CORD UMBIL (MISCELLANEOUS) IMPLANT
CLOTH BEACON ORANGE TIMEOUT ST (SAFETY) ×3 IMPLANT
DERMABOND ADVANCED (GAUZE/BANDAGES/DRESSINGS) ×2
DERMABOND ADVANCED .7 DNX12 (GAUZE/BANDAGES/DRESSINGS) ×1 IMPLANT
DRAPE SHEET LG 3/4 BI-LAMINATE (DRAPES) IMPLANT
DRSG OPSITE POSTOP 4X10 (GAUZE/BANDAGES/DRESSINGS) ×3 IMPLANT
DURAPREP 26ML APPLICATOR (WOUND CARE) ×3 IMPLANT
ELECT REM PT RETURN 9FT ADLT (ELECTROSURGICAL) ×3
ELECTRODE REM PT RTRN 9FT ADLT (ELECTROSURGICAL) ×1 IMPLANT
EXTRACTOR VACUUM M CUP 4 TUBE (SUCTIONS) IMPLANT
EXTRACTOR VACUUM M CUP 4' TUBE (SUCTIONS)
GLOVE BIO SURGEON STRL SZ7 (GLOVE) ×3 IMPLANT
GOWN STRL REUS W/TWL LRG LVL3 (GOWN DISPOSABLE) ×6 IMPLANT
KIT ABG SYR 3ML LUER SLIP (SYRINGE) ×3 IMPLANT
LIQUID BAND (GAUZE/BANDAGES/DRESSINGS) ×3 IMPLANT
NEEDLE HYPO 25X5/8 SAFETYGLIDE (NEEDLE) ×3 IMPLANT
NS IRRIG 1000ML POUR BTL (IV SOLUTION) ×3 IMPLANT
PACK C SECTION WH (CUSTOM PROCEDURE TRAY) ×3 IMPLANT
PAD OB MATERNITY 4.3X12.25 (PERSONAL CARE ITEMS) ×3 IMPLANT
STAPLER VISISTAT 35W (STAPLE) IMPLANT
SUT CHROMIC 0 CTX 36 (SUTURE) ×9 IMPLANT
SUT MON AB 4-0 PS1 27 (SUTURE) ×3 IMPLANT
SUT PDS AB 0 CT 36 (SUTURE) ×3 IMPLANT
SUT PLAIN 0 NONE (SUTURE) IMPLANT
SUT PLAIN 2 0 XLH (SUTURE) IMPLANT
SUT VIC AB 3-0 CT1 27 (SUTURE) ×2
SUT VIC AB 3-0 CT1 TAPERPNT 27 (SUTURE) ×1 IMPLANT
TOWEL OR 17X24 6PK STRL BLUE (TOWEL DISPOSABLE) ×3 IMPLANT
TRAY FOLEY CATH 14FR (SET/KITS/TRAYS/PACK) ×3 IMPLANT
WATER STERILE IRR 1000ML POUR (IV SOLUTION) ×3 IMPLANT

## 2014-05-09 NOTE — Transfer of Care (Signed)
Immediate Anesthesia Transfer of Care Note  Patient: [name]  Procedure(s) Performed: Procedure(s): CESAREAN SECTION (N/A)  Patient Location: PACU  Anesthesia Type:Epidural  Level of Consciousness: awake, alert  and oriented  Airway & Oxygen Therapy: Patient Spontanous Breathing  Post-op Assessment: Report given to PACU RN and Post -op Vital signs reviewed and stable  Post vital signs: Reviewed and stable  Complications: No apparent anesthesia complications

## 2014-05-09 NOTE — Progress Notes (Signed)
Patient requests IV pain medication. Reported patient having UCs every 1-4 min. 2 prolonged decelerations , last one at 2 am, since then baby is reactive. Orders received.

## 2014-05-09 NOTE — Plan of Care (Signed)
Problem: Consults Goal: Prenatal labs/testing reviewed upon admission Outcome: Completed/Met Date Met:  05/09/14  Problem: Phase I Progression Outcomes Goal: Obtain and review prenatal records Outcome: Completed/Met Date Met:  05/09/14 Goal: Pain controlled with appropriate interventions Outcome: Progressing Goal: OOB as tolerated unless otherwise ordered Outcome: Progressing Goal: Induction meds as ordered Outcome: Completed/Met Date Met:  05/09/14 Goal: Pitocin as ordered Outcome: Completed/Met Date Met:  05/09/14 Goal: Assess/evaluate labor progress and adequacy Outcome: Progressing

## 2014-05-09 NOTE — Anesthesia Postprocedure Evaluation (Signed)
Anesthesia Post Note  Patient: [name]  Procedure(s) Performed: Procedure(s) (LRB): CESAREAN SECTION (N/A)  Anesthesia type: Epidural  Patient location: PACU  Post pain: Pain level controlled  Post assessment: Post-op Vital signs reviewed  Last Vitals:  Filed Vitals:   05/09/14 1415  BP: 98/64  Pulse: 73  Temp:   Resp: 15    Post vital signs: Reviewed  Level of consciousness: awake  Complications: No apparent anesthesia complications

## 2014-05-09 NOTE — Anesthesia Procedure Notes (Signed)
Epidural Patient location during procedure: OB Start time: 05/09/2014 9:22 AM End time: 05/09/2014 9:26 AM  Staffing Anesthesiologist: Lyn Hollingshead  Preanesthetic Checklist Completed: patient identified, surgical consent, pre-op evaluation, timeout performed, IV checked, risks and benefits discussed and monitors and equipment checked  Epidural Patient position: sitting Prep: site prepped and draped and DuraPrep Patient monitoring: continuous pulse ox and blood pressure Approach: midline Location: L3-L4 Injection technique: LOR air  Needle:  Needle type: Tuohy  Needle gauge: 17 G Needle length: 9 cm and 9 Needle insertion depth: 6 cm Catheter type: closed end flexible Catheter size: 19 Gauge Catheter at skin depth: 11 cm Test dose: negative and Other  Assessment Sensory level: T9 Events: blood not aspirated, injection not painful, no injection resistance, negative IV test and no paresthesia  Additional Notes Reason for block:procedure for pain

## 2014-05-09 NOTE — Brief Op Note (Signed)
05/08/2014 - 05/09/2014  12:38 PM  PATIENT:  [name]  33 y.o. female  PRE-OPERATIVE DIAGNOSIS:  Fetal decels  Fetal Intolerance to Labor  POST-OPERATIVE DIAGNOSIS:  Fetal decels  Fetal Intolerance to Labor  PROCEDURE:  Procedure(s): CESAREAN SECTION (N/A)  SURGEON:  Surgeon(s) and Role:    * Darlyn Chamber, MD - Primary  PHYSICIAN ASSISTANT:   ASSISTANTS: none   ANESTHESIA:   epidural  EBL:  Total I/O In: 1000 [I.V.:1000] Out: 700 [Urine:200; Blood:500]  BLOOD ADMINISTERED:none  DRAINS: Urinary Catheter (Foley)   LOCAL MEDICATIONS USED:  NONE  SPECIMEN:  Source of Specimen:  placenta  DISPOSITION OF SPECIMEN:  PATHOLOGY  COUNTS:  YES  TOURNIQUET:  * No tourniquets in log *  DICTATION: .Other Dictation: Dictation Number [id number]  PLAN OF CARE: Admit to inpatient   PATIENT DISPOSITION:  PACU - hemodynamically stable.   Delay start of Pharmacological VTE agent (>24hrs) due to surgical blood loss or risk of bleeding: no

## 2014-05-09 NOTE — Op Note (Signed)
[name], [name] NO.:  [id number]  MEDICAL RECORD NO.:  [id number]  LOCATION:  [facility]                          FACILITY:  [facility]  PHYSICIAN:  Darlyn Chamber, M.D.   DATE OF BIRTH:  14-Jan-1981  DATE OF PROCEDURE: DATE OF DISCHARGE:                              OPERATIVE REPORT   PREOPERATIVE DIAGNOSIS:  Intrauterine pregnancy at 39 plus weeks with fetal intolerance of labor.  POSTOPERATIVE DIAGNOSIS:  Intrauterine pregnancy at 39 plus weeks with fetal intolerance of labor.  OPERATIVE PROCEDURE:  Low-transverse cesarean section.  SURGEON:  Darlyn Chamber, MD  ANESTHESIA:  Epidural.  ESTIMATED BLOOD LOSS:  500 mL.  PACKS:  None.  DRAINS:  Included urethral Foley.  Intraoperative blood replacement was none.  COMPLICATIONS:  None.  INDICATIONS:  As follows; a 33 year old primigravida female, at 48 plus weeks, presented with spontaneous rupture of membranes.  It was noted that she had a previous cryo of the cervix.  She progressed slowly to complete effacement, but the cervix would not dilate.  Fetal vertex was at a -2 to -3 station.  Subsequently she received an epidural.  I was able to manually dilate cervix to approximately 5 cm.  The 4 bags are ruptured, it was slightly meconium stained fluid.  Fetal vertex remained high.  The infant had been having episodes of fetal bradycardic, but these responded nicely to position change and  hydration.  She received epidural, had another prolonged episode of bradycardia, probably into the 80s.  Again this responded to position change, hydration, and discontinued the Pitocin.  Scalp electrode was put in place.  After this, we began to have repetitive late decelerations that persisted despite our attempts at conservative management.  The fetal vertex remained high and the cervix remained 5 cm.  We were remote from delivery, in a view of the fetal heart rate pattern, decided to proceed with primary cesarean  section.  The risks were discussed, including the risk of infection.  The risk of hemorrhage that could require transfusion with the risk of AIDS or hepatitis.  Excessive bleeding could require hysterectomy.  There is a risk of injury to adjacent organs including bladder, bowel, ureters that could require further exploratory surgery.  Risk of deep venous thrombosis and pulmonary embolus.  The patient does understand indications and risks.  Procedure as follows; the patient was taken to the OR, placed in supine position with left lateral tilt.  After satisfactory level of epidural anesthesia was obtained, the abdomen was prepped out with DuraPrep and eventually draped as a sterile field.  A low transverse skin incision made with a knife and carried through subcutaneous tissue.  The anterior rectus fascia was entered sharply and the incision of fascia done laterally.  The fascia taken off the muscle superiorly and inferiorly. Rectus muscle was separated in the midline.  Perineum was entered sharply.  Incision of perineum extended both superiorly and inferiorly. A low-transverse bladder flap was developed.  A low transverse uterine incision began with knife, extended laterally using manual traction. The infant presented in the vertex presentation, delivered with elevation of head and fundal pressure.  The infant was a viable female, weighing  8 pounds 5 ounces.  Apgars were 8/9.  Umbilical artery pH was 7.18.  Placenta was delivered manually and sent to Pathology.  Of note, amniotic fluid was slightly meconium stained.  Uterus was exteriorized for closure.  Tubes and ovaries were unremarkable.  Uterus was closed in a running locking suture of 0 chromic using a 2-layer closure technique. Areas of bleeding were brought under control with figure-of-eights of 0 chromic.  The uterus was returned to the abdominal cavity.  We irrigated the pelvis.  The left side incision was an arterial Pumper.   We exteriorized the uterus again.  Using 0 chromic, we did an O'Leary stitch over the artery and tied it down.  With this we had good hemostasis.  Uterus returned back to the abdominal cavity.  We irrigated the pelvis, and observed the uterine incision closely.  No active bleeding was noted.  At this point in time, peritoneum was closed with running suture of 3-0 Vicryl, fascia was closed with running suture of 0 PDS.  Skin was closed in running subcuticular 4-0 Monocryl and Dermabond.  Sponge, instrument, and needle count was reported as correct by circulating nurse x3.  The patient tolerated procedure well,  was turned to recovery in good condition.     Darlyn Chamber, M.D.     JSM/MEDQ  D:  05/09/2014  T:  05/09/2014  Job:  [id number]

## 2014-05-09 NOTE — Lactation Note (Signed)
This note was copied from the chart of [name]. Lactation Consultation Note  Patient Name: [name] Date: 05/09/2014 Reason for consult: Initial assessment Baby asleep STS on Mom. Basic teaching reviewed with Mom. Baby has been to the breast few times today with RN assisting Mom. Encouraged to BF with feeding ques. Lactation brochure left for review. Advised of OP services and support group. Encouraged Mom to call for assist as needed or questions/concerns.   Maternal Data Has patient been taught Hand Expression?: Yes Does the patient have breastfeeding experience prior to this delivery?: No  Feeding Feeding Type: Breast Fed Length of feed: 45 min  LATCH Score/Interventions Latch: Repeated attempts needed to sustain latch, nipple held in mouth throughout feeding, stimulation needed to elicit sucking reflex. Intervention(s): Skin to skin;Teach feeding cues;Waking techniques Intervention(s): Adjust position;Assist with latch;Breast massage;Breast compression  Audible Swallowing: A few with stimulation Intervention(s): Skin to skin;Hand expression Intervention(s): Skin to skin  Type of Nipple: Everted at rest and after stimulation  Comfort (Breast/Nipple): Soft / non-tender     Hold (Positioning): Assistance needed to correctly position infant at breast and maintain latch. Intervention(s): Breastfeeding basics reviewed;Support Pillows;Position options;Skin to skin  LATCH Score: 7  Lactation Tools Discussed/Used     Consult Status Consult Status: Follow-up Date: 05/10/14 Follow-up type: In-patient    [name] 05/09/2014, 7:52 PM

## 2014-05-09 NOTE — Anesthesia Preprocedure Evaluation (Addendum)

## 2014-05-09 NOTE — H&P (Signed)
[name] is a 33 y.o. female presenting at 25+ with SROM.  Negative GBS. FHR now cat one.  Several episodes of bradycardia responded to position changes. Maternal Medical History:  Reason for admission: Rupture of membranes.   Contractions: Onset was 3-5 hours ago.   Frequency: irregular.   Perceived severity is moderate.    Fetal activity: Perceived fetal activity is normal.    Prenatal complications: No PIH.   Prenatal Complications - Diabetes: none.    OB History    Gravida Para Term Preterm AB TAB SAB Ectopic Multiple Living   1 0 0 0 0 0 0 0 0 0      Past Medical History  Diagnosis Date  . Abnormal Pap smear   . H/O hematuria     negative micros times 3  . Childhood asthma    Past Surgical History  Procedure Laterality Date  . Cryotherapy  2004  . Colposcopy  2004    mild dysplasia   Family History: family history includes Hypertension in her brother and mother; Thyroid disease in her father. Social History:  reports that she has never smoked. She does not have any smokeless tobacco history on file. She reports that she does not drink alcohol or use illicit drugs.   Prenatal Transfer Tool  Maternal Diabetes: No Genetic Screening: normal Maternal Ultrasounds/Referrals: Normal Fetal Ultrasounds or other Referrals:  None Maternal Substance Abuse:  No Significant Maternal Medications:  None Significant Maternal Lab Results:  None Other Comments:  None  ROS  Dilation: Fingertip Effacement (%): 50 Station: -1 Exam by:: n licato rn Blood pressure 129/63, pulse 91, temperature 97.7 F (36.5 C), temperature source Axillary, resp. rate 18, height 5\' 5"  (1.651 m), weight 85.73 kg (189 lb), SpO2 98 %. Maternal Exam:  Uterine Assessment: Contraction strength is moderate.  Contraction frequency is regular.   Abdomen: Patient reports no abdominal tenderness. Fundal height is c/w dates.   Estimated fetal weight is 7.   Fetal presentation: vertex  Introitus:  Amniotic fluid character: meconium stained.  Cervix: Fingertip and 50%  Physical Exam  Prenatal labs: ABO, Rh: --/--/A POS, A POS (11/14 1915) Antibody: NEG (11/14 1915) Rubella: Immune (04/13 0000) RPR: NON REAC (11/14 1915)  HBsAg: Negative (04/13 0000)  HIV: Non-reactive (04/13 0000)  GBS: Negative (10/29 0000)   Assessment/Plan: IUP at 39+ with SROM Begin pitocin risk discussed. Follow FHR>   [name] S 05/09/2014, 8:24 AM

## 2014-05-09 NOTE — Progress Notes (Signed)
Patient ID: [name], female   DOB: Aug 08, 1980, 33 y.o.   MRN: [id number] AFTER EPIDURAL WAS ABLE TO MANUALLY DILATE CERVIX TO 5 CM FORE BAG NOTED RUPTURED VTX -2 FHR WITH EPISODES OF BRADYCARDIA  NOW RECURRENT LATE DECELS NO RESPONSE TO POSITION CHANGES OR HYDRATION BP OK VTX STILL HIGH AND CERVIX STILL 5 CM PRECEDE WITH CESAREAN SECTION Risk of cesarean section discussed.  These include:  Risk of infection;  Risk of hemorrhage that could require transfusions with the associated risk of aids or hepatitis;  Excessive bleeding could require hysterectomy;  Risk of injury to adjacent organs including bladder, bowel or ureters;  Risk of DVT's and possible pulmonary embolus.  Patient expresses a understanding of indications and risks.;

## 2014-05-09 NOTE — Progress Notes (Signed)
Reported to Dr. Radene Knee patient is rating pain 6/10. SVE 0.5cm, SROM 9 hours. Orders received.

## 2014-05-09 NOTE — Plan of Care (Signed)
Problem: Phase I Progression Outcomes Goal: Pain controlled with appropriate interventions Outcome: Completed/Met Date Met:  05/09/14 Goal: Foley catheter patent Outcome: Completed/Met Date Met:  05/09/14 Goal: OOB as tolerated unless otherwise ordered Outcome: Completed/Met Date Met:  05/09/14 Goal: IS, TCDB as ordered Outcome: Completed/Met Date Met:  05/09/14 Goal: VS, stable, temp < 100.4 degrees F Outcome: Completed/Met Date Met:  05/09/14

## 2014-05-10 ENCOUNTER — Encounter (HOSPITAL_COMMUNITY): Payer: Self-pay | Admitting: Obstetrics and Gynecology

## 2014-05-10 LAB — CBC
HEMATOCRIT: 29.4 % — AB (ref 36.0–46.0)
Hemoglobin: 10 g/dL — ABNORMAL LOW (ref 12.0–15.0)
MCH: 31 pg (ref 26.0–34.0)
MCHC: 34 g/dL (ref 30.0–36.0)
MCV: 91 fL (ref 78.0–100.0)
Platelets: 238 10*3/uL (ref 150–400)
RBC: 3.23 MIL/uL — ABNORMAL LOW (ref 3.87–5.11)
RDW: 13.7 % (ref 11.5–15.5)
WBC: 13.4 10*3/uL — ABNORMAL HIGH (ref 4.0–10.5)

## 2014-05-10 NOTE — Plan of Care (Signed)
Problem: Phase I Progression Outcomes Goal: Voiding adequately Outcome: Completed/Met Date Met:  05/10/14 Goal: Initial discharge plan identified Outcome: Completed/Met Date Met:  05/10/14 Goal: Other Phase I Outcomes/Goals Outcome: Not Applicable Date Met:  05/10/14  Problem: Phase II Progression Outcomes Goal: Incision intact & without signs/symptoms of infection Outcome: Completed/Met Date Met:  05/10/14 Goal: Other Phase II Outcomes/Goals Outcome: Not Applicable Date Met:  05/10/14  Problem: Discharge Progression Outcomes Goal: Activity appropriate for discharge plan Outcome: Completed/Met Date Met:  05/10/14 Goal: Tolerating diet Outcome: Completed/Met Date Met:  05/10/14     

## 2014-05-10 NOTE — Addendum Note (Signed)
Addendum  created 05/10/14 0745 by Asher Muir, CRNA   Modules edited: Notes Section   Notes Section:  File: [id number]

## 2014-05-10 NOTE — Progress Notes (Signed)
Subjective: Postpartum Day 1: Cesarean Delivery Patient reports tolerating PO and no problems voiding.    Objective: Vital signs in last 24 hours: Temp:  [97.2 F (36.2 C)-98.4 F (36.9 C)] 98.2 F (36.8 C) (11/16 0532) Pulse Rate:  [73-136] 83 (11/16 0532) Resp:  [15-28] 16 (11/16 0532) BP: (74-129)/(39-102) 99/58 mmHg (11/16 0532) SpO2:  [74 %-100 %] 96 % (11/16 0532)  Physical Exam:  General: alert, cooperative and no distress Lochia: appropriate Uterine Fundus: firm Incision: healing well DVT Evaluation: No evidence of DVT seen on physical exam.   Recent Labs  05/08/14 1915 05/10/14 0540  HGB 12.0 10.0*  HCT 35.0* 29.4*    Assessment/Plan: Status post Cesarean section. Doing well postoperatively.  Continue current care.  [name],Alvira Hecht E 05/10/2014, 8:00 AM

## 2014-05-10 NOTE — Anesthesia Postprocedure Evaluation (Signed)
Anesthesia Post Note  Patient: [name]  Procedure(s) Performed: Procedure(s) (LRB): CESAREAN SECTION (N/A)  Anesthesia type: Epidural  Patient location: Mother/Baby  Post pain: Pain level controlled  Post assessment: Post-op Vital signs reviewed  Last Vitals:  Filed Vitals:   05/10/14 0532  BP: 99/58  Pulse: 83  Temp: 36.8 C  Resp: 16    Post vital signs: Reviewed  Level of consciousness:alert  Complications: No apparent anesthesia complications

## 2014-05-10 NOTE — Progress Notes (Signed)
Subjective: Postpartum Day 1: Cesarean Delivery Patient reports tolerating PO and no problems voiding.    Objective: Vital signs in last 24 hours: Temp:  [97.2 F (36.2 C)-98.4 F (36.9 C)] 98.2 F (36.8 C) (11/16 0532) Pulse Rate:  [73-136] 83 (11/16 0532) Resp:  [15-28] 16 (11/16 0532) BP: (74-129)/(39-102) 99/58 mmHg (11/16 0532) SpO2:  [74 %-100 %] 96 % (11/16 0532)  Physical Exam:  General: alert and cooperative Lochia: appropriate Uterine Fundus: firm Incision: healing well DVT Evaluation: No evidence of DVT seen on physical exam. Negative Homan's sign. No cords or calf tenderness. No significant calf/ankle edema.   Recent Labs  05/08/14 1915 05/10/14 0540  HGB 12.0 10.0*  HCT 35.0* 29.4*    Assessment/Plan: Status post Cesarean section. Doing well postoperatively.  Continue current care.  Mialee Weyman G 05/10/2014, 7:58 AM

## 2014-05-10 NOTE — Lactation Note (Signed)
This note was copied from the chart of [name]. Lactation Consultation Note  Patient Name: [name] [id number] Date: 05/10/2014 Reason for consult: Follow-up assessment of this mom and baby, now 32 hours postpartum.  Mom and her nurse both report that baby is latching mostly on her (R) breast and LATCH assessments=7 consistently by Conservation officer, historic buildings.  LC discussed slide-over technique for encouraging baby to latch to (L) breast after starting on favored (R).  LC reviewed cue feedings and mom to call for latch assistance as needed.   Maternal Data    Feeding Feeding Type: Breast Fed  LATCH Score/Interventions Latch: Repeated attempts needed to sustain latch, nipple held in mouth throughout feeding, stimulation needed to elicit sucking reflex. Intervention(s): Skin to skin;Teach feeding cues;Waking techniques Intervention(s): Adjust position;Assist with latch;Breast massage;Breast compression  Audible Swallowing: A few with stimulation Intervention(s): Skin to skin;Hand expression Intervention(s): Alternate breast massage  Type of Nipple: Everted at rest and after stimulation  Comfort (Breast/Nipple): Soft / non-tender     Hold (Positioning): Assistance needed to correctly position infant at breast and maintain latch. Intervention(s): Breastfeeding basics reviewed;Support Pillows;Position options;Skin to skin  LATCH Score: 7 (most recent feeding assessment by RN, karen)  Lactation Tools Discussed/Used   STS, cue feedings, normal breastfeeding frequency and duration during newborn period  Consult Status Consult Status: Follow-up Date: 05/11/14 Follow-up type: In-patient    [name] [facility] 05/10/2014, 8:39 PM

## 2014-05-10 NOTE — Plan of Care (Signed)
Problem: Phase II Progression Outcomes Goal: Pain controlled on oral analgesia Outcome: Completed/Met Date Met:  05/10/14 Goal: Progress activity as tolerated unless otherwise ordered Outcome: Completed/Met Date Met:  05/10/14 Goal: Afebrile, VS remain stable Outcome: Completed/Met Date Met:  05/10/14 Goal: Tolerating diet Outcome: Completed/Met Date Met:  05/10/14

## 2014-05-11 NOTE — Lactation Note (Signed)
This note was copied from the chart of [place]. Lactation Consultation Note  Patient Name: [name] Date: 05/11/2014 Reason for consult: Follow-up assessment;Other (Comment) (breasts are filling/swelling and becoming firmer) Mom's breasts are filling and becoming firmer today. RN has already provided ice packs for mom to apply to breasts for 20 minute periods for comfort and to decrease swelling.  Mom is cue feeding baby and has been encouraged to ensure frequent STS and feeding opportunities during engorgement period. Baby has been exclusively breastfeeding since birth with copious output and seven feedings since midnight today.  Feedings are >10 minutes each.  LC encouraged mom to follow engorgement recommendations and call for help as needed.   Maternal Data    Feeding Feeding Type: Breast Fed Length of feed: 10 min  LATCH Score/Interventions Latch: Repeated attempts needed to sustain latch, nipple held in mouth throughout feeding, stimulation needed to elicit sucking reflex. Intervention(s): Skin to skin;Waking techniques Intervention(s): Adjust position;Assist with latch;Breast massage  Audible Swallowing: A few with stimulation Intervention(s): Skin to skin;Hand expression Intervention(s): Hand expression;Skin to skin  Type of Nipple: Everted at rest and after stimulation  Comfort (Breast/Nipple): Filling, red/small blisters or bruises, mild/mod discomfort  Problem noted: Filling Interventions (Filling): Frequent nursing Interventions (Mild/moderate discomfort): Hand expression;Pre-pump if needed (ice packs for 20 minute intervals as needed)  Hold (Positioning): No assistance needed to correctly position infant at breast. Intervention(s): Support Pillows;Position options  LATCH Score: 7  (most recent feeding assessment, per RN)  Lactation Tools Discussed/Used   Engorgement care with frequent breastfeeding, ice packs PRN, brief pumping only for  relief (pre or post) Cue feedings (8-12 feeds per 24 hours)  Consult Status Consult Status: Follow-up Date: 05/12/14 Follow-up type: In-patient    [name] [facility] 05/11/2014, 8:44 PM

## 2014-05-11 NOTE — Progress Notes (Signed)
Subjective: Postpartum Day 2: Cesarean Delivery Patient reports tolerating PO, + BM and no problems voiding.    Objective: Vital signs in last 24 hours: Temp:  [97.4 F (36.3 C)-98.5 F (36.9 C)] 98.5 F (36.9 C) (11/17 0553) Pulse Rate:  [75-100] 82 (11/17 0553) Resp:  [18-20] 18 (11/17 0553) BP: (98-111)/(45-82) 98/59 mmHg (11/17 0553) SpO2:  [99 %] 99 % (11/16 1000)  Physical Exam:  General: alert and cooperative Lochia: appropriate Uterine Fundus: firm Incision: healing well DVT Evaluation: No evidence of DVT seen on physical exam. Negative Homan's sign. No cords or calf tenderness. No significant calf/ankle edema.   Recent Labs  05/08/14 1915 05/10/14 0540  HGB 12.0 10.0*  HCT 35.0* 29.4*    Assessment/Plan: Status post Cesarean section. Doing well postoperatively.  Continue current care.  Rosabel Sermeno G 05/11/2014, 8:15 AM

## 2014-05-12 MED ORDER — IBUPROFEN 600 MG PO TABS: 600.0000 mg | ORAL_TABLET | Freq: Four times a day (QID) | ORAL | Status: DC

## 2014-05-12 MED ORDER — OXYCODONE-ACETAMINOPHEN 5-325 MG PO TABS: 1.0000 | ORAL_TABLET | ORAL | Status: DC | PRN

## 2014-05-12 NOTE — Lactation Note (Signed)
This note was copied from the chart of [place]. Lactation Consultation Note  Mother having difficulty sustaining latch on left breast.  Breasts are filling. Left nipple flatter than right.  Attempted on left but switched to right to get him started. Then attempted again on left with no success.  Introduced #24NS.  Baby was able to latch on left and sustain with NS for 20 min. Transitional breastmilk view in NS.  Mother has a good breastmilk supply, is dripping from other breast during feeding. Suggest she try first without the NS and use if needed.  Reinforced the need for her to prepump to evert left nipple prior to feeding. Reviewed engorgement care and provided her with comfort gels for sore nipples. Offered oupatient appt but mother states she will call if needed.  Patient Name: [name] Date: 05/12/2014 Reason for consult: Infant weight loss   Maternal Data    Feeding Feeding Type: Breast Fed Length of feed: 20 min (off and on #24NS on left)  LATCH Score/Interventions Latch: Repeated attempts needed to sustain latch, nipple held in mouth throughout feeding, stimulation needed to elicit sucking reflex. Intervention(s): Skin to skin;Waking techniques Intervention(s): Adjust position;Assist with latch;Breast massage  Audible Swallowing: Spontaneous and intermittent Intervention(s): Hand expression Intervention(s): Skin to skin  Type of Nipple: Everted at rest and after stimulation  Comfort (Breast/Nipple): Filling, red/small blisters or bruises, mild/mod discomfort  Interventions (Filling): Frequent nursing;Hand pump Interventions (Mild/moderate discomfort): Hand massage  Hold (Positioning): Assistance needed to correctly position infant at breast and maintain latch.  LATCH Score: 7  Lactation Tools Discussed/Used     Consult Status Consult Status: Complete    Carlye Grippe 05/12/2014, 9:52 AM

## 2014-05-12 NOTE — Discharge Summary (Signed)
Obstetric Discharge Summary Reason for Admission: rupture of membranes Prenatal Procedures: ultrasound Intrapartum Procedures: cesarean: low cervical, transverse Postpartum Procedures: none Complications-Operative and Postpartum: none HEMOGLOBIN  Date Value Ref Range Status  05/10/2014 10.0* 12.0 - 15.0 g/dL Final   HCT  Date Value Ref Range Status  05/10/2014 29.4* 36.0 - 46.0 % Final    Physical Exam:  General: alert and cooperative Lochia: appropriate Uterine Fundus: firm Incision: healing well DVT Evaluation: No evidence of DVT seen on physical exam. Negative Homan's sign. No cords or calf tenderness. No significant calf/ankle edema.  Discharge Diagnoses: Term Pregnancy-delivered  Discharge Information: Date: 05/12/2014 Activity: pelvic rest Diet: routine Medications: PNV, Ibuprofen and Percocet Condition: stable Instructions: refer to practice specific booklet Discharge to: home   Newborn Data: Live born female  Birth Weight: 8 lb 5 oz (3771 g) APGAR: 8, 9  Home with mother.  CURTIS,CAROL G 05/12/2014, 8:15 AM

## 2014-05-12 NOTE — Plan of Care (Signed)
Problem: Consults Goal: Postpartum Patient Education (See Patient Education module for education specifics.)  Outcome: Completed/Met Date Met:  05/12/14

## 2014-05-12 NOTE — Plan of Care (Signed)
Problem: Discharge Progression Outcomes Goal: Barriers To Progression Addressed/Resolved Outcome: Completed/Met Date Met:  05/12/14     

## 2014-07-01 ENCOUNTER — Other Ambulatory Visit: Payer: Self-pay | Admitting: Obstetrics & Gynecology

## 2014-07-02 LAB — CYTOLOGY - PAP

## 2014-11-02 ENCOUNTER — Telehealth: Payer: Self-pay | Admitting: *Deleted

## 2014-11-02 NOTE — Telephone Encounter (Signed)
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  Pt was previously seen by Dr. Charlies Constable and has not scheduled AEX.  Please call pt to schedule AEX or verify pt is receiving care elsewhere.  Thank you.

## 2014-11-03 NOTE — Telephone Encounter (Signed)
Pt has transferred care.  Pt taken out of recall. Routing to provider for final review.  Closing encounter.

## 2014-11-03 NOTE — Telephone Encounter (Signed)
Called and s/w patient to try and schedule her for AEX, patient says she has transferred her care to [facility] and has been seeing them for her visits.

## 2015-02-09 LAB — OB RESULTS CONSOLE HEPATITIS B SURFACE ANTIGEN: Hepatitis B Surface Ag: NEGATIVE

## 2015-02-09 LAB — OB RESULTS CONSOLE GC/CHLAMYDIA
CHLAMYDIA, DNA PROBE: NEGATIVE
Gonorrhea: NEGATIVE

## 2015-02-09 LAB — OB RESULTS CONSOLE RPR: RPR: NONREACTIVE

## 2015-02-09 LAB — OB RESULTS CONSOLE ABO/RH: RH TYPE: POSITIVE

## 2015-02-09 LAB — OB RESULTS CONSOLE HIV ANTIBODY (ROUTINE TESTING): HIV: NONREACTIVE

## 2015-02-09 LAB — OB RESULTS CONSOLE RUBELLA ANTIBODY, IGM: Rubella: IMMUNE

## 2015-02-09 LAB — OB RESULTS CONSOLE ANTIBODY SCREEN: Antibody Screen: NEGATIVE

## 2015-02-18 ENCOUNTER — Ambulatory Visit (HOSPITAL_COMMUNITY): Payer: BC Managed Care – PPO

## 2015-02-22 ENCOUNTER — Ambulatory Visit (HOSPITAL_COMMUNITY)
Admission: RE | Admit: 2015-02-22 | Discharge: 2015-02-22 | Disposition: A | Discharge status: Home / Self Care | Payer: BC Managed Care – PPO | Source: Ambulatory Visit | Attending: Obstetrics & Gynecology | Admitting: Obstetrics & Gynecology

## 2015-02-22 ENCOUNTER — Ambulatory Visit (HOSPITAL_COMMUNITY): Admission: RE | Admit: 2015-02-22 | Payer: BC Managed Care – PPO | Source: Ambulatory Visit

## 2015-02-22 ENCOUNTER — Encounter (HOSPITAL_COMMUNITY): Payer: Self-pay

## 2015-02-22 ENCOUNTER — Ambulatory Visit (HOSPITAL_COMMUNITY)
Admission: RE | Admit: 2015-02-22 | Discharge: 2015-02-22 | Disposition: A | Discharge status: Home / Self Care | Payer: BC Managed Care – PPO | Source: Ambulatory Visit | Attending: Obstetrics and Gynecology | Admitting: Obstetrics and Gynecology

## 2015-02-22 VITALS — BP 118/68 | HR 83 | Wt 161.4 lb

## 2015-02-22 DIAGNOSIS — Z3A15 15 weeks gestation of pregnancy: Secondary | ICD-10-CM | POA: Insufficient documentation

## 2015-02-22 DIAGNOSIS — O2632 Retained intrauterine contraceptive device in pregnancy, second trimester: Secondary | ICD-10-CM | POA: Diagnosis not present

## 2015-02-22 DIAGNOSIS — Z36 Encounter for antenatal screening of mother: Secondary | ICD-10-CM | POA: Insufficient documentation

## 2015-02-22 NOTE — Progress Notes (Signed)
MFM Consult  34 year old, G3P1011, with EDD 08/13/2015 at 15 week 3 days gestation with an unexpected pregnancy with an IUD in place.   Past Medical History  Diagnosis Date  . Abnormal Pap smear   . H/O hematuria     negative micros times 3  . Childhood asthma    Past Surgical History  Procedure Laterality Date  . Cryotherapy  2004  . Colposcopy  2004    mild dysplasia  . Cesarean section N/A 05/09/2014    Procedure: CESAREAN SECTION;  Surgeon: Darlyn Chamber, MD;  Location: [facility];  Service: Obstetrics;  Laterality: N/A;   Social History   Social History  . Marital Status: Single    Spouse Name: N/A  . Number of Children: N/A  . Years of Education: N/A   Occupational History  . Not on file.   Social History Main Topics  . Smoking status: Never Smoker   . Smokeless tobacco: Not on file  . Alcohol Use: No  . Drug Use: No  . Sexual Activity: Yes   Other Topics Concern  . Not on file   Social History Narrative   Allergies  Allergen Reactions  . Amoxicillin Swelling    Facial swelling    #1 Spontaneous pregnancy with IUD in place (Mirena) - Ultrasound today showed the IUD location to be in the right lateral fundal region under the placenta - We discussed the risk of adverse pregnancy outcome including an increased risk of preterm labor and delivery (fourfold increase), second trimester fetal loss, and infection, but no proven increase in risk of birth defects. She does not desire termination of the pregnancy after discussion of the maternal and fetal/neonatal risks. In addition, in a LNg20 or LNg14 IUD user, there are theoretical fetal risks associated with intrauterine hormone exposure. Given this information, for pregnancies after 12 weeks, we suggest removing the IUD by pulling on the strings if the strings are visible and removal is unlikely to disrupt the placenta or membranes (based upon ultrasound localization of the IUD and placenta). If the IUD appears embedded  in the placenta, located behind the placenta, or protrudes into the gestational sac, we suggest leaving the IUD in situ. - In the setting of miscarriage with IUD in place, we suggest removing the IUD and prescribing antibiotics (eg, doxycycline 100 mg twice a day for seven days she has amoxicillin allergy). - Management through pregnancy would include increased assessment for preterm labor (visit every other week starting ~20-22 weeks, serial ultrasound for evaluation of fetal growth (given the leiomyomata), and daily maternal assessment of fetal movement starting ~ [redacted] weeks gestation with immediate provider contact for decrease or cessation of fetal movement.  #2 Advanced maternal age - It looks like from her clinic notes that she has had counseling by her primary provider and NIPS ordered (panorama).  - let us know if we can be of any further assistance for this issue. 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 See above for management  Questions appear answered to her satisfaction. Precautions for the above given. Spent greater than 1/2 of 30 minute visit face to face counseling and real time evaluation during the ultrasound.

## 2015-02-25 ENCOUNTER — Encounter (HOSPITAL_COMMUNITY): Payer: Self-pay | Admitting: Obstetrics & Gynecology

## 2015-03-04 ENCOUNTER — Ambulatory Visit (HOSPITAL_COMMUNITY)
Admission: RE | Admit: 2015-03-04 | Discharge: 2015-03-04 | Disposition: A | Discharge status: Home / Self Care | Payer: BC Managed Care – PPO | Source: Ambulatory Visit | Attending: Obstetrics & Gynecology | Admitting: Obstetrics & Gynecology

## 2015-03-04 DIAGNOSIS — O09522 Supervision of elderly multigravida, second trimester: Secondary | ICD-10-CM

## 2015-03-04 DIAGNOSIS — Z315 Encounter for genetic counseling: Secondary | ICD-10-CM | POA: Insufficient documentation

## 2015-03-04 DIAGNOSIS — Z3A16 16 weeks gestation of pregnancy: Secondary | ICD-10-CM | POA: Diagnosis not present

## 2015-03-04 DIAGNOSIS — O09529 Supervision of elderly multigravida, unspecified trimester: Secondary | ICD-10-CM | POA: Insufficient documentation

## 2015-03-04 DIAGNOSIS — O2632 Retained intrauterine contraceptive device in pregnancy, second trimester: Secondary | ICD-10-CM

## 2015-03-04 NOTE — Progress Notes (Signed)
Genetic Counseling  High-Risk Gestation Note  Appointment Date:  03/04/2015 Referred By: Linda Hedges, DO Date of Birth:  [birth year]   Pregnancy History: G2P1001 Estimated Date of Delivery: 08/13/15 Estimated Gestational Age: [redacted]w[redacted]d Attending: [place], MD   Ms. [name] was seen for genetic counseling because of a maternal age of 34 y.o.. She will be 34 years old at delivery.   In Summary:   Discussed risks for fetal aneuploidy associated with advanced maternal age  Patient elected to have NIPS (Panorama) drawn today, declined amniocentesis  Patient elected to return for detailed ultrasound, scheduled for 03/22/15  Previous MFM consult for presence of IUD in pregnancy; See previous note for detailed discussion  Offered option of fetal echocardiogram given alcohol exposure on weekends in the first trimester, prior to being aware of pregnancy. Patient to further consider this option.     She was counseled regarding maternal age and the association with risk for chromosome conditions due to nondisjunction with aging of the ova.   We reviewed chromosomes, nondisjunction, and the associated 1 in 111 risk for fetal aneuploidy at [redacted]w[redacted]d gestation related to a maternal age of 34 years old at delivery.  She was counseled that the risk for aneuploidy decreases as gestational age increases, accounting for those pregnancies which spontaneously abort.  We specifically discussed Down syndrome (trisomy 16), trisomies 32 and 6, and sex chromosome aneuploidies (47,XXX and 47,XXY) including the common features and prognoses of each.   We reviewed available screening options including noninvasive prenatal screening (NIPS)/cell free DNA (cfDNA) testing and detailed ultrasound.  She was counseled that screening tests are used to modify a patient's a priori risk for aneuploidy, typically based on age. This estimate provides a pregnancy specific risk assessment. We reviewed the benefits and  limitations of each option. Specifically, we discussed the conditions for which each test screens, the detection rates, and false positive rates of each. She was also counseled regarding diagnostic testing via amniocentesis. We reviewed the approximate 1 in 753-005 risk for complications for amniocentesis, including spontaneous pregnancy loss. After consideration of all the options, she elected to proceed with NIPS (Panorama through [facility]).  Those results will be available in 8-10 days.  She declined amniocentesis.   The patient also expressed interest in having a detailed ultrasound, which was scheduled for 03/22/15.  She understands that screening tests cannot rule out all birth defects or genetic syndromes. The patient was advised of this limitation and states she still does not want additional testing at this time.   Ms. [name] was provided with written information regarding sickle cell anemia (SCA) including the carrier frequency and incidence in the African-American population, the availability of carrier testing and prenatal diagnosis if indicated.  In addition, we discussed that hemoglobinopathies are routinely screened for as part of the [place] newborn screening panel.  OB medical records indicate that screening was performed in the previous pregnancy. If screening for additional hemoglobin variants has not been performed and is desired, this can be performed via hemoglobin electrophoresis.   Both family histories were reviewed and found to be noncontributory for birth defects, intellectual disability, and known genetic conditions. Without further information regarding the provided family history, an accurate genetic risk cannot be calculated. Further genetic counseling is warranted if more information is obtained.  Ms. [name] denied exposure to environmental toxins or chemical agents. She denied the use of tobacco or street drugs. She reported drinking alcohol on weekends during  the first 12 weeks  of pregnancy, at which time she was unaware of the pregnancy given that she has an IUD in place.  Prenatal alcohol exposure can increase the risk for growth delays, small head size, heart defects, eye and facial differences, as well as behavior problems and learning disabilities. The risk of these to occur tends to increase with the amount of alcohol consumed. However, because there is no identified safe amount of alcohol in pregnancy, it is recommended to completely avoid alcohol in pregnancy. We discussed the benefits and limitations of detailed ultrasound and fetal echocardiogram to assess fetal growth and development given this early exposure. Ms. [name] plans to pursue detailed ultrasound and will further consider option of fetal echocardiogram. She denied significant viral illnesses during the course of her pregnancy. Her medical and surgical histories were noncontributory. See previous MFM consult note for detailed discussion regarding unexpected pregnancy with IUD in place.   I counseled Ms. [name] regarding the above risks and available options.  The approximate face-to-face time with the genetic counselor was 40 minutes.  Chipper Oman, MS,  Insurance risk surveyor

## 2015-03-14 ENCOUNTER — Telehealth (HOSPITAL_COMMUNITY): Payer: Self-pay | Admitting: *Deleted

## 2015-03-14 ENCOUNTER — Other Ambulatory Visit (HOSPITAL_COMMUNITY): Payer: Self-pay | Admitting: Obstetrics & Gynecology

## 2015-03-14 NOTE — Telephone Encounter (Signed)
TC to pt, name and DOB verified.  Low risk Panorama results given to pt.  Pt voices understanding.

## 2015-03-22 ENCOUNTER — Ambulatory Visit (HOSPITAL_COMMUNITY)
Admission: RE | Admit: 2015-03-22 | Discharge: 2015-03-22 | Disposition: A | Discharge status: Home / Self Care | Payer: BC Managed Care – PPO | Source: Ambulatory Visit | Attending: Obstetrics & Gynecology | Admitting: Obstetrics & Gynecology

## 2015-03-22 ENCOUNTER — Encounter (HOSPITAL_COMMUNITY): Payer: Self-pay

## 2015-03-22 ENCOUNTER — Other Ambulatory Visit (HOSPITAL_COMMUNITY): Payer: Self-pay | Admitting: Obstetrics and Gynecology

## 2015-03-22 DIAGNOSIS — O2632 Retained intrauterine contraceptive device in pregnancy, second trimester: Secondary | ICD-10-CM

## 2015-03-22 DIAGNOSIS — O09522 Supervision of elderly multigravida, second trimester: Secondary | ICD-10-CM | POA: Diagnosis not present

## 2015-03-22 DIAGNOSIS — O34219 Maternal care for unspecified type scar from previous cesarean delivery: Secondary | ICD-10-CM

## 2015-03-22 DIAGNOSIS — Z3A19 19 weeks gestation of pregnancy: Secondary | ICD-10-CM | POA: Diagnosis not present

## 2015-03-22 DIAGNOSIS — O3421 Maternal care for scar from previous cesarean delivery: Secondary | ICD-10-CM | POA: Diagnosis not present

## 2015-04-18 ENCOUNTER — Ambulatory Visit (HOSPITAL_COMMUNITY): Payer: BC Managed Care – PPO

## 2015-07-27 LAB — OB RESULTS CONSOLE GBS: STREP GROUP B AG: POSITIVE

## 2015-08-06 NOTE — H&P (Signed)
[name] is a 35 y.o. female presenting for repeat cesarean section.  Antepartum course complicated by retained IUD, nl serial ultrasounds with IUD embedded in placenta.  AMA-NIPT wnl.  GBS positive.   Maternal Medical History:  Fetal activity: Perceived fetal activity is normal.   Last perceived fetal movement was within the past hour.    Prenatal complications: no prenatal complications Prenatal Complications - Diabetes: none.    OB History    Gravida Para Term Preterm AB TAB SAB Ectopic Multiple Living   2 1 1  0 0 0 0 0 0 1     Past Medical History  Diagnosis Date  . Abnormal Pap smear   . H/O hematuria     negative micros times 3  . Childhood asthma    Past Surgical History  Procedure Laterality Date  . Cryotherapy  2004  . Colposcopy  2004    mild dysplasia  . Cesarean section N/A 05/09/2014    Procedure: CESAREAN SECTION;  Surgeon: Darlyn Chamber, MD;  Location: [facility];  Service: Obstetrics;  Laterality: N/A;   Family History: family history includes Hypertension in her brother and mother; Thyroid disease in her father. Social History:  reports that she has never smoked. She does not have any smokeless tobacco history on file. She reports that she does not drink alcohol or use illicit drugs.   Prenatal Transfer Tool  Maternal Diabetes: No Genetic Screening: Normal Maternal Ultrasounds/Referrals: Normal Fetal Ultrasounds or other Referrals:  Referred to Materal Fetal Medicine  Maternal Substance Abuse:  No Significant Maternal Medications:  None Significant Maternal Lab Results:  Lab values include: Group B Strep positive Other Comments:  None  ROS    unknown if currently breastfeeding. Maternal Exam:  Abdomen: Patient reports no abdominal tenderness. Surgical scars: low transverse.   Fundal height is c/w dates .   Estimated fetal weight is 7#8.       Physical Exam  Constitutional: She is oriented to person, place, and time. She appears well-developed  and well-nourished.  GI: Soft. There is no rebound and no guarding.  Neurological: She is alert and oriented to person, place, and time.  Skin: Skin is warm and dry.  Psychiatric: She has a normal mood and affect. Her behavior is normal.    Prenatal labs: ABO, Rh: A/Positive/-- (08/17 0000) Antibody: Negative (08/17 0000) Rubella: Immune (08/17 0000) RPR: Nonreactive (08/17 0000)  HBsAg: Negative (08/17 0000)  HIV: Non-reactive (08/17 0000)  GBS:     Assessment/Plan: 35yo G2P1001 at [redacted]w[redacted]d for Rpt C/S -Patient was counseled for risk of bleeding, infection, scarring and damage to surrounding structures.  All questions were answered and the patient wishes to proceed.  [name], [place] 08/06/2015, 9:40 PM

## 2015-08-08 ENCOUNTER — Encounter (HOSPITAL_COMMUNITY): Payer: Self-pay

## 2015-08-08 ENCOUNTER — Encounter (HOSPITAL_COMMUNITY)
Admission: RE | Admit: 2015-08-08 | Discharge: 2015-08-08 | Disposition: A | Discharge status: Home / Self Care | Payer: BC Managed Care – PPO | Source: Ambulatory Visit | Attending: Obstetrics & Gynecology | Admitting: Obstetrics & Gynecology

## 2015-08-08 VITALS — BP 99/73 | HR 89 | Temp 97.7°F | Resp 16 | Ht 65.5 in | Wt 184.0 lb

## 2015-08-08 DIAGNOSIS — O09529 Supervision of elderly multigravida, unspecified trimester: Secondary | ICD-10-CM

## 2015-08-08 LAB — CBC
HCT: 35.6 % — ABNORMAL LOW (ref 36.0–46.0)
Hemoglobin: 12.3 g/dL (ref 12.0–15.0)
MCH: 31.1 pg (ref 26.0–34.0)
MCHC: 34.6 g/dL (ref 30.0–36.0)
MCV: 90.1 fL (ref 78.0–100.0)
PLATELETS: 275 10*3/uL (ref 150–400)
RBC: 3.95 MIL/uL (ref 3.87–5.11)
RDW: 13.6 % (ref 11.5–15.5)
WBC: 10.3 10*3/uL (ref 4.0–10.5)

## 2015-08-08 LAB — TYPE AND SCREEN
ABO/RH(D): A POS
Antibody Screen: NEGATIVE

## 2015-08-08 MED ORDER — GENTAMICIN SULFATE 40 MG/ML IJ SOLN
INTRAVENOUS | Status: AC
  Administered 2015-08-09: 114.5 mL via INTRAVENOUS
  Filled 2015-08-08: qty 8.5

## 2015-08-08 NOTE — Patient Instructions (Signed)
Your procedure is scheduled on:  Tuesday, Feb. 14, 2017  Enter through the Main Entrance of [facility] at: 6:00 A.M.  Pick up the phone at the desk and dial [phone].  Call this number if you have problems the morning of surgery: [phone].  Remember: Do NOT eat food or drink after:  Midnight Tonight  Take these medicines the morning of surgery with a SIP OF WATER: None  Do NOT wear jewelry (body piercing), metal hair clips/bobby pins, or nail polish. Do NOT wear lotions, powders, or perfumes.  You may wear deoderant. Do NOT shave for 48 hours prior to surgery. Do NOT bring valuables to the hospital.  Leave suitcase in car.  After surgery it may be brought to your room.  For patients admitted to the hospital, checkout time is 11:00 AM the day of discharge.

## 2015-08-09 ENCOUNTER — Encounter (HOSPITAL_COMMUNITY)
Admission: AD | Disposition: A | Discharge status: Home / Self Care | Payer: Self-pay | Source: Ambulatory Visit | Attending: Obstetrics and Gynecology

## 2015-08-09 ENCOUNTER — Encounter (HOSPITAL_COMMUNITY): Admission: RE | Payer: Self-pay | Source: Ambulatory Visit

## 2015-08-09 ENCOUNTER — Inpatient Hospital Stay (HOSPITAL_COMMUNITY)
Admission: AD | Admit: 2015-08-09 | Discharge: 2015-08-11 | DRG: 766 | Disposition: A | Discharge status: Home / Self Care | Payer: BC Managed Care – PPO | Source: Ambulatory Visit | Attending: Obstetrics and Gynecology | Admitting: Obstetrics and Gynecology

## 2015-08-09 ENCOUNTER — Inpatient Hospital Stay (HOSPITAL_COMMUNITY): Payer: BC Managed Care – PPO | Admitting: Anesthesiology

## 2015-08-09 ENCOUNTER — Encounter (HOSPITAL_COMMUNITY): Payer: Self-pay | Admitting: *Deleted

## 2015-08-09 ENCOUNTER — Inpatient Hospital Stay (HOSPITAL_COMMUNITY)
Admission: RE | Admit: 2015-08-09 | Payer: BC Managed Care – PPO | Source: Ambulatory Visit | Admitting: Obstetrics & Gynecology

## 2015-08-09 DIAGNOSIS — Z87891 Personal history of nicotine dependence: Secondary | ICD-10-CM

## 2015-08-09 DIAGNOSIS — Z3A39 39 weeks gestation of pregnancy: Secondary | ICD-10-CM

## 2015-08-09 DIAGNOSIS — O09529 Supervision of elderly multigravida, unspecified trimester: Secondary | ICD-10-CM

## 2015-08-09 DIAGNOSIS — O34211 Maternal care for low transverse scar from previous cesarean delivery: Secondary | ICD-10-CM | POA: Diagnosis present

## 2015-08-09 DIAGNOSIS — Z30432 Encounter for removal of intrauterine contraceptive device: Secondary | ICD-10-CM | POA: Diagnosis not present

## 2015-08-09 DIAGNOSIS — Z98891 History of uterine scar from previous surgery: Secondary | ICD-10-CM

## 2015-08-09 LAB — RPR: RPR: NONREACTIVE

## 2015-08-09 SURGERY — Surgical Case
Anesthesia: Regional

## 2015-08-09 SURGERY — Surgical Case
Anesthesia: Spinal

## 2015-08-09 MED ORDER — NALBUPHINE HCL 10 MG/ML IJ SOLN: 5.0000 mg | Freq: Once | INTRAMUSCULAR | Status: DC | PRN

## 2015-08-09 MED ORDER — LANOLIN HYDROUS EX OINT: 1.0000 "application " | TOPICAL_OINTMENT | CUTANEOUS | Status: DC | PRN

## 2015-08-09 MED ORDER — WITCH HAZEL-GLYCERIN EX PADS: 1.0000 "application " | MEDICATED_PAD | CUTANEOUS | Status: DC | PRN

## 2015-08-09 MED ORDER — ONDANSETRON HCL 4 MG/2ML IJ SOLN
INTRAMUSCULAR | Status: AC
  Filled 2015-08-09: qty 2

## 2015-08-09 MED ORDER — ONDANSETRON HCL 4 MG/2ML IJ SOLN: 4.0000 mg | Freq: Three times a day (TID) | INTRAMUSCULAR | Status: DC | PRN

## 2015-08-09 MED ORDER — TETANUS-DIPHTH-ACELL PERTUSSIS 5-2.5-18.5 LF-MCG/0.5 IM SUSP: 0.5000 mL | Freq: Once | INTRAMUSCULAR | Status: DC

## 2015-08-09 MED ORDER — FENTANYL CITRATE (PF) 100 MCG/2ML IJ SOLN
INTRAMUSCULAR | Status: AC
  Filled 2015-08-09: qty 2

## 2015-08-09 MED ORDER — LACTATED RINGERS IV SOLN
INTRAVENOUS | Status: DC | PRN
  Administered 2015-08-09 (×3): via INTRAVENOUS

## 2015-08-09 MED ORDER — NALOXONE HCL 0.4 MG/ML IJ SOLN
0.4000 mg | INTRAMUSCULAR | Status: DC | PRN
Start: 2015-08-09 — End: 2015-08-11

## 2015-08-09 MED ORDER — MORPHINE SULFATE (PF) 0.5 MG/ML IJ SOLN
INTRAMUSCULAR | Status: DC | PRN
  Administered 2015-08-09: .2 mg via INTRATHECAL
  Administered 2015-08-09: 4.8 mg via INTRAVENOUS

## 2015-08-09 MED ORDER — PHENYLEPHRINE 8 MG IN D5W 100 ML (0.08MG/ML) PREMIX OPTIME
INJECTION | INTRAVENOUS | Status: DC | PRN
  Administered 2015-08-09: 60 ug/min via INTRAVENOUS

## 2015-08-09 MED ORDER — NALBUPHINE HCL 10 MG/ML IJ SOLN
5.0000 mg | INTRAMUSCULAR | Status: DC | PRN
  Administered 2015-08-09: 5 mg via SUBCUTANEOUS
  Filled 2015-08-09: qty 1

## 2015-08-09 MED ORDER — OXYTOCIN 10 UNIT/ML IJ SOLN
INTRAMUSCULAR | Status: AC
Start: 2015-08-09 — End: 2015-08-09
  Filled 2015-08-09: qty 4

## 2015-08-09 MED ORDER — MENTHOL 3 MG MT LOZG: 1.0000 | LOZENGE | OROMUCOSAL | Status: DC | PRN

## 2015-08-09 MED ORDER — DIPHENHYDRAMINE HCL 25 MG PO CAPS
25.0000 mg | ORAL_CAPSULE | Freq: Four times a day (QID) | ORAL | Status: DC | PRN
  Filled 2015-08-09: qty 1

## 2015-08-09 MED ORDER — KETOROLAC TROMETHAMINE 30 MG/ML IJ SOLN
30.0000 mg | Freq: Four times a day (QID) | INTRAMUSCULAR | Status: AC | PRN
  Administered 2015-08-09: 30 mg via INTRAVENOUS

## 2015-08-09 MED ORDER — SCOPOLAMINE 1 MG/3DAYS TD PT72
MEDICATED_PATCH | TRANSDERMAL | Status: DC | PRN
  Administered 2015-08-09: 1 via TRANSDERMAL

## 2015-08-09 MED ORDER — NALOXONE HCL 2 MG/2ML IJ SOSY
1.0000 ug/kg/h | PREFILLED_SYRINGE | INTRAMUSCULAR | Status: DC | PRN
  Filled 2015-08-09: qty 2

## 2015-08-09 MED ORDER — IBUPROFEN 600 MG PO TABS
600.0000 mg | ORAL_TABLET | Freq: Four times a day (QID) | ORAL | Status: DC
  Administered 2015-08-09 – 2015-08-11 (×8): 600 mg via ORAL
  Filled 2015-08-09 (×9): qty 1

## 2015-08-09 MED ORDER — SODIUM CHLORIDE 0.9% FLUSH: 3.0000 mL | INTRAVENOUS | Status: DC | PRN

## 2015-08-09 MED ORDER — SIMETHICONE 80 MG PO CHEW
80.0000 mg | CHEWABLE_TABLET | ORAL | Status: DC
  Administered 2015-08-10 (×2): 80 mg via ORAL
  Filled 2015-08-09 (×2): qty 1

## 2015-08-09 MED ORDER — PROMETHAZINE HCL 25 MG/ML IJ SOLN: 6.2500 mg | INTRAMUSCULAR | Status: DC | PRN

## 2015-08-09 MED ORDER — KETOROLAC TROMETHAMINE 30 MG/ML IJ SOLN: 30.0000 mg | Freq: Four times a day (QID) | INTRAMUSCULAR | Status: AC | PRN

## 2015-08-09 MED ORDER — DIPHENHYDRAMINE HCL 50 MG/ML IJ SOLN
12.5000 mg | INTRAMUSCULAR | Status: DC | PRN
Start: 2015-08-09 — End: 2015-08-11

## 2015-08-09 MED ORDER — ACETAMINOPHEN 500 MG PO TABS
1000.0000 mg | ORAL_TABLET | Freq: Four times a day (QID) | ORAL | Status: AC
  Administered 2015-08-10: 1000 mg via ORAL
  Filled 2015-08-09: qty 2

## 2015-08-09 MED ORDER — OXYCODONE-ACETAMINOPHEN 5-325 MG PO TABS
1.0000 | ORAL_TABLET | ORAL | Status: DC | PRN
  Filled 2015-08-09: qty 1

## 2015-08-09 MED ORDER — SENNOSIDES-DOCUSATE SODIUM 8.6-50 MG PO TABS
2.0000 | ORAL_TABLET | ORAL | Status: DC
  Administered 2015-08-10 (×2): 2 via ORAL
  Filled 2015-08-09 (×2): qty 2

## 2015-08-09 MED ORDER — PRENATAL MULTIVITAMIN CH
1.0000 | ORAL_TABLET | Freq: Every day | ORAL | Status: DC
  Administered 2015-08-09 – 2015-08-11 (×3): 1 via ORAL
  Filled 2015-08-09 (×3): qty 1

## 2015-08-09 MED ORDER — NALBUPHINE HCL 10 MG/ML IJ SOLN: 5.0000 mg | INTRAMUSCULAR | Status: DC | PRN

## 2015-08-09 MED ORDER — BUPIVACAINE IN DEXTROSE 0.75-8.25 % IT SOLN
INTRATHECAL | Status: DC | PRN
  Administered 2015-08-09: 2 mL via INTRATHECAL

## 2015-08-09 MED ORDER — MORPHINE SULFATE (PF) 0.5 MG/ML IJ SOLN
INTRAMUSCULAR | Status: AC
  Filled 2015-08-09: qty 10

## 2015-08-09 MED ORDER — ACETAMINOPHEN 325 MG PO TABS
650.0000 mg | ORAL_TABLET | ORAL | Status: DC | PRN
  Administered 2015-08-10: 650 mg via ORAL
  Filled 2015-08-09: qty 2

## 2015-08-09 MED ORDER — PHENYLEPHRINE 8 MG IN D5W 100 ML (0.08MG/ML) PREMIX OPTIME
INJECTION | INTRAVENOUS | Status: AC
  Filled 2015-08-09: qty 100

## 2015-08-09 MED ORDER — DIBUCAINE 1 % RE OINT: 1.0000 "application " | TOPICAL_OINTMENT | RECTAL | Status: DC | PRN

## 2015-08-09 MED ORDER — DEXAMETHASONE SODIUM PHOSPHATE 4 MG/ML IJ SOLN
INTRAMUSCULAR | Status: DC | PRN
  Administered 2015-08-09: 4 mg via INTRAVENOUS

## 2015-08-09 MED ORDER — SIMETHICONE 80 MG PO CHEW
80.0000 mg | CHEWABLE_TABLET | Freq: Three times a day (TID) | ORAL | Status: DC
  Administered 2015-08-09 – 2015-08-10 (×3): 80 mg via ORAL
  Filled 2015-08-09 (×4): qty 1

## 2015-08-09 MED ORDER — SIMETHICONE 80 MG PO CHEW: 80.0000 mg | CHEWABLE_TABLET | ORAL | Status: DC | PRN

## 2015-08-09 MED ORDER — ONDANSETRON HCL 4 MG/2ML IJ SOLN
INTRAMUSCULAR | Status: DC | PRN
  Administered 2015-08-09: 4 mg via INTRAVENOUS

## 2015-08-09 MED ORDER — ZOLPIDEM TARTRATE 5 MG PO TABS: 5.0000 mg | ORAL_TABLET | Freq: Every evening | ORAL | Status: DC | PRN

## 2015-08-09 MED ORDER — FENTANYL CITRATE (PF) 100 MCG/2ML IJ SOLN
INTRAMUSCULAR | Status: DC | PRN
  Administered 2015-08-09: 40 ug via INTRAVENOUS
  Administered 2015-08-09: 10 ug via INTRATHECAL
  Administered 2015-08-09 (×2): 25 ug via INTRAVENOUS

## 2015-08-09 MED ORDER — KETOROLAC TROMETHAMINE 30 MG/ML IJ SOLN
INTRAMUSCULAR | Status: AC
  Administered 2015-08-09: 30 mg via INTRAVENOUS
  Filled 2015-08-09: qty 1

## 2015-08-09 MED ORDER — DIPHENHYDRAMINE HCL 25 MG PO CAPS
25.0000 mg | ORAL_CAPSULE | ORAL | Status: DC | PRN
  Administered 2015-08-09 (×2): 25 mg via ORAL
  Filled 2015-08-09 (×2): qty 1

## 2015-08-09 MED ORDER — OXYCODONE-ACETAMINOPHEN 5-325 MG PO TABS: 2.0000 | ORAL_TABLET | ORAL | Status: DC | PRN

## 2015-08-09 MED ORDER — OXYTOCIN 10 UNIT/ML IJ SOLN: 2.5000 [IU]/h | INTRAVENOUS | Status: AC

## 2015-08-09 MED ORDER — CITRIC ACID-SODIUM CITRATE 334-500 MG/5ML PO SOLN
ORAL | Status: AC
  Administered 2015-08-09: 30 mL
  Filled 2015-08-09: qty 15

## 2015-08-09 MED ORDER — MEPERIDINE HCL 25 MG/ML IJ SOLN: 6.2500 mg | INTRAMUSCULAR | Status: DC | PRN

## 2015-08-09 MED ORDER — LACTATED RINGERS IV SOLN: INTRAVENOUS | Status: DC

## 2015-08-09 SURGICAL SUPPLY — 35 items
BENZOIN TINCTURE PRP APPL 2/3 (GAUZE/BANDAGES/DRESSINGS) ×3 IMPLANT
CLAMP CORD UMBIL (MISCELLANEOUS) IMPLANT
CLOSURE STERI-STRIP 1/2X4 (GAUZE/BANDAGES/DRESSINGS) ×1
CLOSURE WOUND 1/2 X4 (GAUZE/BANDAGES/DRESSINGS)
CLOTH BEACON ORANGE TIMEOUT ST (SAFETY) ×3 IMPLANT
CLSR STERI-STRIP ANTIMIC 1/2X4 (GAUZE/BANDAGES/DRESSINGS) ×2 IMPLANT
CONTAINER PREFILL 10% NBF 15ML (MISCELLANEOUS) IMPLANT
DRSG OPSITE POSTOP 4X10 (GAUZE/BANDAGES/DRESSINGS) ×3 IMPLANT
DURAPREP 26ML APPLICATOR (WOUND CARE) ×3 IMPLANT
ELECT REM PT RETURN 9FT ADLT (ELECTROSURGICAL) ×3
ELECTRODE REM PT RTRN 9FT ADLT (ELECTROSURGICAL) ×1 IMPLANT
EXTRACTOR VACUUM M CUP 4 TUBE (SUCTIONS) IMPLANT
EXTRACTOR VACUUM M CUP 4' TUBE (SUCTIONS)
GLOVE BIO SURGEON STRL SZ8 (GLOVE) ×3 IMPLANT
GLOVE BIOGEL PI IND STRL 7.0 (GLOVE) ×1 IMPLANT
GLOVE BIOGEL PI INDICATOR 7.0 (GLOVE) ×2
GOWN STRL REUS W/TWL LRG LVL3 (GOWN DISPOSABLE) ×6 IMPLANT
KIT ABG SYR 3ML LUER SLIP (SYRINGE) ×3 IMPLANT
LIQUID BAND (GAUZE/BANDAGES/DRESSINGS) IMPLANT
NEEDLE HYPO 25X5/8 SAFETYGLIDE (NEEDLE) ×3 IMPLANT
NS IRRIG 1000ML POUR BTL (IV SOLUTION) ×3 IMPLANT
PACK C SECTION WH (CUSTOM PROCEDURE TRAY) ×3 IMPLANT
PAD OB MATERNITY 4.3X12.25 (PERSONAL CARE ITEMS) ×3 IMPLANT
PENCIL SMOKE EVAC W/HOLSTER (ELECTROSURGICAL) ×3 IMPLANT
STRIP CLOSURE SKIN 1/2X4 (GAUZE/BANDAGES/DRESSINGS) IMPLANT
SUT MNCRL 0 VIOLET CTX 36 (SUTURE) ×4 IMPLANT
SUT MONOCRYL 0 CTX 36 (SUTURE) ×8
SUT PDS AB 0 CTX 60 (SUTURE) ×3 IMPLANT
SUT PLAIN 0 NONE (SUTURE) IMPLANT
SUT PLAIN 2 0 (SUTURE)
SUT PLAIN 2 0 XLH (SUTURE) IMPLANT
SUT PLAIN ABS 2-0 CT1 27XMFL (SUTURE) IMPLANT
SUT VIC AB 4-0 KS 27 (SUTURE) ×3 IMPLANT
TOWEL OR 17X24 6PK STRL BLUE (TOWEL DISPOSABLE) ×3 IMPLANT
TRAY FOLEY CATH SILVER 14FR (SET/KITS/TRAYS/PACK) ×3 IMPLANT

## 2015-08-09 NOTE — Progress Notes (Signed)
Subjective: Postpartum Day 0: Cesarean Delivery Patient reports tolerating PO.    Objective: Vital signs in last 24 hours: Temp:  [97.5 F (36.4 C)-97.7 F (36.5 C)] 97.5 F (36.4 C) (02/14 0644) Pulse Rate:  [73-109] 73 (02/14 0800) Resp:  [13-20] 18 (02/14 0800) BP: (90-116)/(42-73) 104/64 mmHg (02/14 0800) SpO2:  [94 %-99 %] 98 % (02/14 0800) Weight:  [181 lb 8 oz (82.328 kg)-184 lb (83.462 kg)] 181 lb 8 oz (82.328 kg) (02/14 0218)  Physical Exam:  General: alert and cooperative Lochia: appropriate Uterine Fundus: firm Incision: small drainage noted on honeycomb dressing DVT Evaluation: No evidence of DVT seen on physical exam. Negative Homan's sign. No cords or calf tenderness. No significant calf/ankle edema.   Recent Labs  08/08/15 0925  HGB 12.3  HCT 35.6*    Assessment/Plan: Status post Cesarean section. Doing well postoperatively.  Continue current care.  [name] G 08/09/2015, 8:28 AM

## 2015-08-09 NOTE — Anesthesia Postprocedure Evaluation (Signed)
Anesthesia Post Note  Patient: [name]  Procedure(s) Performed: Procedure(s) (LRB): CESAREAN SECTION (N/A)  Patient location during evaluation: Mother Baby Anesthesia Type: Spinal Level of consciousness: awake and alert and oriented Pain management: pain level controlled Vital Signs Assessment: post-procedure vital signs reviewed and stable Cardiovascular status: blood pressure returned to baseline Postop Assessment: no headache, no backache, patient able to bend at knees, no signs of nausea or vomiting and adequate PO intake Anesthetic complications: no    Last Vitals:  Filed Vitals:   08/09/15 0630 08/09/15 0644  BP: 103/62 98/57  Pulse: 83 85  Temp:  36.4 C  Resp: 16 16    Last Pain:  Filed Vitals:   08/09/15 0655  PainSc: 4                  Braylon Grenda

## 2015-08-09 NOTE — Anesthesia Procedure Notes (Signed)
Spinal Patient location during procedure: OR Start time: 08/09/2015 4:07 AM End time: 08/09/2015 4:08 AM Staffing Anesthesiologist: Catalina Gravel Performed by: anesthesiologist  Preanesthetic Checklist Completed: patient identified, surgical consent, pre-op evaluation, timeout performed, IV checked, risks and benefits discussed and monitors and equipment checked Spinal Block Patient position: sitting Prep: site prepped and draped and DuraPrep Patient monitoring: continuous pulse ox and blood pressure Approach: midline Location: L3-4 Needle Needle type: Pencan  Needle gauge: 24 G Needle length: 9 cm Assessment Sensory level: T4 Additional Notes Functioning IV was confirmed and monitors were applied. Sterile prep and drape, including hand hygiene, mask and sterile gloves were used. The patient was positioned and the spine was prepped. The skin was anesthetized with lidocaine.  Free flow of clear CSF was obtained prior to injecting local anesthetic into the CSF.  The spinal needle aspirated freely following injection.  The needle was carefully withdrawn.  The patient tolerated the procedure well. Consent was obtained prior to procedure with all questions answered and concerns addressed. Risks including but not limited to bleeding, infection, nerve damage, paralysis, failed block, inadequate analgesia, allergic reaction, high spinal, itching and headache were discussed and the patient wished to proceed.   Hoy Morn, MD

## 2015-08-09 NOTE — Addendum Note (Signed)
Addendum  created 08/09/15 0749 by Talbot Grumbling, CRNA   Modules edited: Clinical Notes   Clinical Notes:  File: [id number]

## 2015-08-09 NOTE — Lactation Note (Signed)
This note was copied from a baby's chart. Lactation Consultation Note  P2, Mother breastfed other child for approx 6 months. Reviewed hand expression and mother was able to express drops of colostrum. Mother latched baby in cross cradle hold.  Encouraged her to massage her breasts during feeding and flange bottom lip. Sucks and swallows observed. Reviewed waking techniques, STS and suggest she breastfeed on both breasts. Mom encouraged to feed baby 8-12 times/24 hours and with feeding cues.  Mom made aware of O/P services, breastfeeding support groups, community resources, and our phone # for post-discharge questions.    Patient Name: [name] [id number] Date: 08/09/2015 Reason for consult: Initial assessment   Maternal Data Has patient been taught Hand Expression?: Yes Does the patient have breastfeeding experience prior to this delivery?: Yes  Feeding Feeding Type: Breast Fed Length of feed: 12 min  LATCH Score/Interventions Latch: Grasps breast easily, tongue down, lips flanged, rhythmical sucking.  Audible Swallowing: A few with stimulation  Type of Nipple: Everted at rest and after stimulation  Comfort (Breast/Nipple): Soft / non-tender     Hold (Positioning): Assistance needed to correctly position infant at breast and maintain latch.  LATCH Score: 8  Lactation Tools Discussed/Used     Consult Status Consult Status: Follow-up Date: 08/10/15 Follow-up type: In-patient    Vivianne Master [facility] 08/09/2015, 11:02 AM

## 2015-08-09 NOTE — MAU Note (Signed)
PT  SAYS SHE IS  [facility]  C/S  FOR  0600  TODAY.         STARTED  UC    AT 715PM      ATE LAST  AT 10PM,   DRANK LAST   AT 1030.

## 2015-08-09 NOTE — Brief Op Note (Signed)
08/09/2015  5:11 AM  PATIENT:  Duffy Rhody  35 y.o. female  PRE-OPERATIVE DIAGNOSIS:  Previous cesarean section, labor  POST-OPERATIVE DIAGNOSIS:  Previous cesarean section, labor  PROCEDURE:  Procedure(s): CESAREAN SECTION (N/A)  SURGEON:  Surgeon(s) and Role:    * Everlene Farrier, MD - Primary  PHYSICIAN ASSISTANT:   ASSISTANTS: none   ANESTHESIA:   spinal  EBL:  Total I/O In: 2500 [I.V.:2500] Out: 900 [Urine:200; Blood:700]  BLOOD ADMINISTERED:none  DRAINS: Urinary Catheter (Foley)   LOCAL MEDICATIONS USED:  NONE  SPECIMEN:  Source of Specimen:  placenta, IUD  DISPOSITION OF SPECIMEN:  PATHOLOGY  COUNTS:  YES  TOURNIQUET:  * No tourniquets in log *  DICTATION: .Other Dictation: Dictation Number [id number]  PLAN OF CARE: Admit to inpatient   PATIENT DISPOSITION:  PACU - hemodynamically stable.   Delay start of Pharmacological VTE agent (>24hrs) due to surgical blood loss or risk of bleeding: not applicable

## 2015-08-09 NOTE — Transfer of Care (Signed)
Immediate Anesthesia Transfer of Care Note  Patient: [name]  Procedure(s) Performed: Procedure(s): CESAREAN SECTION (N/A)  Patient Location: PACU  Anesthesia Type:Spinal  Level of Consciousness: awake, alert  and oriented  Airway & Oxygen Therapy: Patient Spontanous Breathing  Post-op Assessment: Report given to RN and Post -op Vital signs reviewed and stable  Post vital signs: Reviewed and stable  Last Vitals:  Filed Vitals:   08/09/15 0218  BP: 116/68  Pulse: 109  Temp: 36.4 C  Resp: 20    Complications: No apparent anesthesia complications

## 2015-08-09 NOTE — Op Note (Signed)
[name], HARROWER NO.:  [id number]  MEDICAL RECORD NO.:  [id number]  LOCATION:  [facility]                          FACILITY:  [facility]  PHYSICIAN:  Daleen Bo. Gaetano Net, M.D. DATE OF BIRTH:  12-Jul-1980  DATE OF PROCEDURE:  08/09/2015 DATE OF DISCHARGE:                              OPERATIVE REPORT   PREOPERATIVE DIAGNOSES: 1. Intrauterine pregnancy, at 12 and 3/7th weeks. 2. Previous cesarean section. 3. Labor desires repeat C-section.  POSTOPERATIVE DIAGNOSES: 1. Intrauterine pregnancy, at 55 and 3/7th weeks. 2. Previous cesarean section. 3. Labor desires repeat C-section.  PROCEDURE:  Repeat low transverse cesarean section.  SURGEON:  Daleen Bo. Gaetano Net, M.D.  ANESTHESIA:  Spinal.  ESTIMATED BLOOD LOSS:  650 mL.  SPECIMENS:  Placenta and IUD both to Pathology.  FINDINGS:  Viable female infant.  Apgars, arterial cord pH, and weight pending.  INDICATIONS AND CONSENT:  This patient is a 35 year old married African American female, G2, P1, at 13 and 3/9th weeks.  She has a previous cesarean section and desires repeat.  She presents in labor with cervical change.  Repeat cesarean section was discussed with the patient preoperatively.  Potential risks and complications were reviewed preoperatively, including, but not limited to, infection, organ damage, bleeding, requiring transfusion of blood products with HIV and hepatitis acquisition, DVT, PE, and pneumonia.  All questions were answered and consent is signed on the chart.  DESCRIPTION OF PROCEDURE:  The patient was taken to the operating room, where she was identified.  Spinal anesthetic was placed with anesthesiology and she was placed in a dorsal supine position with a 15- degree left lateral wedge.  Foley catheter was then placed and she was then prepped and draped per [facility] protocol.  Time-out is undertaken.  After testing for adequate spinal anesthesia, skin was entered through the previous  Pfannenstiel scar and dissection was carried out in layers to the peritoneum.  Peritoneum was incised, extended superiorly and inferiorly.  Vesicouterine peritoneum was taken down cephalad laterally.  The bladder flap was developed and the bladder blade was placed.  Uterus was incised in a low transverse manner and the uterine cavity was entered bluntly with a hemostat.  Clear fluid was noted.  The uterine incision was extended cephalolaterally with the fingers.  Artificial rupture of membranes for clear fluid is done. Vertex was delivered.  Loose nuchal cord over the shoulder was reduced. Baby was delivered without difficulty.  Good cry and tone was noted. Cord was clamped and cut and the baby was handed to awaiting pediatrics team.  Placenta was manually delivered.  The IUD was then located within the placenta after delivery.  Uterine cavity was clean.  Uterus was closed in 2 running locking imbricating layers of 0 Monocryl suture. Bladder flap is closed with a 2-0 chromic suture.  Good hemostasis was noted.  Irrigation was carried out.  Anterior peritoneum was closed in a running fashion with a 0 Monocryl suture, which was also used to reapproximate the pyramidalis muscle in the midline.  Anterior rectus fascia was closed in a running fashion with a 0 looped PDS suture. Subcutaneous tissue was closed with interrupted plain and the skin was closed  with a subcuticular with 4-0 Vicryl on a Keith needle.  Benzoin and Steri-Strips were applied.  All counts were correct.  The patient was taken recovery room in stable condition. of [employer]  End thank you.     Daleen Bo Gaetano Net, M.D.     JET/MEDQ  D:  08/09/2015  T:  08/09/2015  Job:  [id number]

## 2015-08-09 NOTE — Progress Notes (Signed)
See H&P on chart C/O UCs since about 7pm No ROM, no HA, no epigastric pain  VSS Afeb Lungs CTA Cor RRR  Abdomen no epigastric tenderness  FHT cat one UCs q1.5-3 min  A/P : Labor          Desires repeat C/S          Retained IUD          D/W patient c/s and risks including infection, organ damage, bleeding/transfusion-HIV/Hep, DVT/PE, pneumonia. All questions answered. Patient states she understands and agrees.

## 2015-08-09 NOTE — Anesthesia Postprocedure Evaluation (Signed)
Anesthesia Post Note  Patient: Daveney Wagster  Procedure(s) Performed: Procedure(s) (LRB): CESAREAN SECTION (N/A)  Patient location during evaluation: PACU Anesthesia Type: Spinal Level of consciousness: oriented, awake and alert, awake and patient cooperative Pain management: pain level controlled Vital Signs Assessment: post-procedure vital signs reviewed and stable Respiratory status: spontaneous breathing, nonlabored ventilation and respiratory function stable Cardiovascular status: blood pressure returned to baseline and stable Postop Assessment: no signs of nausea or vomiting, spinal receding, patient able to bend at knees, no backache and no headache Anesthetic complications: no    Last Vitals:  Filed Vitals:   08/09/15 0600 08/09/15 0615  BP: 111/66 96/53  Pulse: 92 81  Temp:    Resp: 15 13    Last Pain:  Filed Vitals:   08/09/15 0620  PainSc: 0-No pain                 Catalina Gravel

## 2015-08-09 NOTE — Anesthesia Preprocedure Evaluation (Addendum)
Anesthesia Evaluation  Patient identified by MRN, date of birth, ID band Patient awake    Reviewed: Allergy & Precautions, NPO status , Patient's Chart, lab work & pertinent test results  History of Anesthesia Complications Negative for: history of anesthetic complications  Airway Mallampati: II  TM Distance: >3 FB Neck ROM: Full    Dental  (+) Teeth Intact, Dental Advisory Given   Pulmonary asthma , former smoker,    Pulmonary exam normal breath sounds clear to auscultation       Cardiovascular negative cardio ROS Normal cardiovascular exam Rhythm:Regular Rate:Normal     Neuro/Psych negative neurological ROS  negative psych ROS   GI/Hepatic negative GI ROS, Neg liver ROS,   Endo/Other  negative endocrine ROS  Renal/GU negative Renal ROS     Musculoskeletal negative musculoskeletal ROS (+)   Abdominal   Peds  Hematology negative hematology ROS (+) Plt 275k   Anesthesia Other Findings Day of surgery medications reviewed with the patient.  Last ate 10pm on 08/08/15  Reproductive/Obstetrics (+) Pregnancy                           Anesthesia Physical Anesthesia Plan  ASA: II and emergent  Anesthesia Plan: Spinal   Post-op Pain Management:    Induction:   Airway Management Planned:   Additional Equipment:   Intra-op Plan:   Post-operative Plan:   Informed Consent: I have reviewed the patients History and Physical, chart, labs and discussed the procedure including the risks, benefits and alternatives for the proposed anesthesia with the patient or authorized representative who has indicated his/her understanding and acceptance.   Dental advisory given  Plan Discussed with: CRNA, Anesthesiologist and Surgeon  Anesthesia Plan Comments: (Discussed risks and benefits of and differences between spinal and general. Discussed risks of spinal including headache, backache, failure,  bleeding, infection, and nerve damage. Patient consents to spinal. Questions answered. Coagulation studies and platelet count acceptable.)       Anesthesia Quick Evaluation

## 2015-08-10 ENCOUNTER — Encounter (HOSPITAL_COMMUNITY): Payer: Self-pay | Admitting: Obstetrics and Gynecology

## 2015-08-10 LAB — CBC
HCT: 28.7 % — ABNORMAL LOW (ref 36.0–46.0)
HEMOGLOBIN: 9.7 g/dL — AB (ref 12.0–15.0)
MCH: 30.7 pg (ref 26.0–34.0)
MCHC: 33.8 g/dL (ref 30.0–36.0)
MCV: 90.8 fL (ref 78.0–100.0)
Platelets: 240 10*3/uL (ref 150–400)
RBC: 3.16 MIL/uL — ABNORMAL LOW (ref 3.87–5.11)
RDW: 14 % (ref 11.5–15.5)
WBC: 13.8 10*3/uL — ABNORMAL HIGH (ref 4.0–10.5)

## 2015-08-10 NOTE — Lactation Note (Signed)
This note was copied from a baby's chart. Lactation Consultation Note; Experienced BF mom latching baby as I went into room. Reports baby has been nursing well- nipples tender but intact. Reviewed basic teaching. No further questions at present. States she feels comfortable with breastfeeding and does not need visit tomorrow. Encouraged to ask for [facility] if needed before DC. Reviewed our phone number to call with questions/concerns after DC.  Patient Name: [name] [id number] Date: 08/10/2015 Reason for consult: Follow-up assessment   Maternal Data Formula Feeding for Exclusion: No Has patient been taught Hand Expression?: Yes Does the patient have breastfeeding experience prior to this delivery?: Yes  Feeding Feeding Type: Breast Fed  LATCH Score/Interventions Latch: Grasps breast easily, tongue down, lips flanged, rhythmical sucking.  Audible Swallowing: A few with stimulation  Type of Nipple: Everted at rest and after stimulation  Comfort (Breast/Nipple): Soft / non-tender     Hold (Positioning): Assistance needed to correctly position infant at breast and maintain latch. Intervention(s): Breastfeeding basics reviewed  LATCH Score: 8  Lactation Tools Discussed/Used     Consult Status Consult Status: Complete    Truddie Crumble 08/10/2015, 2:46 PM

## 2015-08-10 NOTE — Progress Notes (Signed)
Subjective: Postpartum Day 1: Cesarean Delivery Patient reports incisional pain, tolerating PO and no problems voiding.    Objective: Vital signs in last 24 hours: Temp:  [97.4 F (36.3 C)-98.6 F (37 C)] 98.6 F (37 C) (02/15 0128) Pulse Rate:  [58-71] 70 (02/15 0128) Resp:  [16-18] 18 (02/15 0128) BP: (94-112)/(55-63) 112/57 mmHg (02/15 0128) SpO2:  [95 %-98 %] 98 % (02/14 1803)  Physical Exam:  General: alert and cooperative Lochia: appropriate Uterine Fundus: firm Incision: honeycomb dressing CDI , fluctuant area noted above the incision right of midline, questionable seroma DVT Evaluation: No evidence of DVT seen on physical exam. Negative Homan's sign. No cords or calf tenderness. No significant calf/ankle edema.   Recent Labs  08/08/15 0925 08/10/15 0618  HGB 12.3 9.7*  HCT 35.6* 28.7*    Assessment/Plan: Status post Cesarean section. Doing well postoperatively.  Continue current care.  CURTIS,CAROL G 08/10/2015, 8:54 AM  Agree with above.  Patient counseled for circ including risk of bleeding, infection and scarring.  All questions were answered and the patient wishes to proceed.  Linda Hedges, DO

## 2015-08-11 MED ORDER — IBUPROFEN 600 MG PO TABS: 600.0000 mg | ORAL_TABLET | Freq: Four times a day (QID) | ORAL | Status: DC

## 2015-08-11 MED ORDER — OXYCODONE-ACETAMINOPHEN 5-325 MG PO TABS: 1.0000 | ORAL_TABLET | ORAL | Status: DC | PRN

## 2015-08-11 NOTE — Discharge Summary (Signed)
Obstetric Discharge Summary Reason for Admission: cesarean section Prenatal Procedures: ultrasound Intrapartum Procedures: cesarean: low cervical, transverse Postpartum Procedures: none Complications-Operative and Postpartum: none HEMOGLOBIN  Date Value Ref Range Status  08/10/2015 9.7* 12.0 - 15.0 Chang/dL Final    Comment:    DELTA CHECK NOTED REPEATED TO VERIFY    HCT  Date Value Ref Range Status  08/10/2015 28.7* 36.0 - 46.0 % Final    Physical Exam:  General: alert and cooperative Lochia: appropriate Uterine Fundus: firm Incision: healing well, fluid collection improving , only visible upon standing DVT Evaluation: No evidence of DVT seen on physical exam. Negative Homan's sign. No cords or calf tenderness. No significant calf/ankle edema.  Discharge Diagnoses: Term Pregnancy-delivered  Discharge Information: Date: 08/11/2015 Activity: pelvic rest Diet: routine Medications: PNV, Ibuprofen and Percocet Condition: stable Instructions: refer to practice specific booklet Discharge to: home   Newborn Data: Live born female  Birth Weight: 8 lb 4.1 oz (3745 Chang) APGAR: 8, 9  Home with mother.  [name] 08/11/2015, 8:49 AM

## 2015-08-11 NOTE — Clinical Social Work Maternal (Signed)
CLINICAL SOCIAL WORK MATERNAL/CHILD NOTE  Patient Details  Name: [name] MRN: [id number] Date of Birth: 10/04/1980  Date:  28-Sep-2015  Clinical Social Worker Initiating Note:  Elissa Hefty, MSW intern  Date/ Time Initiated:  08/11/15/0915     Child's Name:  [name]    Legal Guardian:  [name]    Need for Interpreter:  None   Date of Referral:  02/04/2016     Reason for Referral:  History of marijuana use   Referral Source:  [facility]   Address:  [place], [place] [address]  Phone number:  [phone]   Household Members:  Self, Significant Other   Natural Supports (not living in the home):  Immediate Family, Friends, Spouse/significant other   Professional Supports: None   Employment: Full-time   Type of Work:     Education:  Engineer, maintenance Resources:      Other Resources:      Cultural/Religious Considerations Which May Impact Care:  None Reported   Strengths:  Ability to meet basic needs , Home prepared for child    Risk Factors/Current Problems:  Substance Use- According to MOB, during check in at the MAU she disclosed that she had smoked marijuana in her high school years. MOB denied any substance use during her first pregnancy in 2015 or this pregnancy.    Cognitive State:  Insightful , Linear Thinking , Goal Oriented    Mood/Affect:  Happy , Interested , Comfortable , Relaxed    CSW Assessment:  MSW intern presented in patient's room due to a history of marijuana use. FOB was present in the room caring for the infant and MOB was coming out of the bathroom. MOB provided verbal consent for MSW intern to engage. MOB presented to be happy and energized as evidence by her moving around and actively smiling and engaging in the assessment. Per, MOB this is her second child and both were C-sections. MOB reported recovering well and feeling good about going into postpartum. MOB expressed  breastfeeding is going well. According to MOB, she has met all of the infant's basic needs and is hoping to go home today. MOB asked if there were local resources for baby supplies she could utilize just to have extra. MOB stated that she has a good support system in the area as well.   MSW intern inquired about MOB's mental health. MOB denied any prior mental health diagnosis along with denying and perinatal mood disorders. However, MOB shared that she has seen a change in her attitude as far as getting irritable quicker. MSW intern asked MOB to describe some scenarios and symptoms for her to get a better understanding. MOB described it as having a "short fuse" and "low tolerance". MOB shared that since her first son was born she has developed really bad road rage and can be "moody". Per MOB, she has been able to recognize her triggers and tries to not take her anger out on others. MOB disclosed she practices breathing techniques and tries to calm herself down when she sees herself getting upset. According to MOB, this new change in her attitude has not affected her daily routine and is able to manage it. MOB did not see it as a concern.  MSW intern provided education on perinatal mood disorders and the hospital's support group. She also left MOB a handout on the topic with additional resources and information. MOB denied having any concerns in regard to  her mental health postpartum.   MSW intern asked MOB about her reported marijuana use history. MOB laughed and shared that she mentioned it when checking in to the hospital but it was something she did when she was in high school. MOB denied any substance use during her pregnancies. MSW intern informed MOB about the hospital's drug screening policy and pending cord tissue drug screen. MOB and FOB both were okay with the policy and denied having any concerns. MOB shared she was not worried about the results because she has not engaged in smoking marijuana since  she was in her teenage years.   MOB denied having any further questions or concerns but agreed to contact MSW intern if needs arise. MOB thanked her for stopping by and informing her of what was going on.  CSW Plan/Description:   Patient/Family Education- MSW intern provided education on perinatal mood disorders and the hospital's support group.  MSW intern to monitor cord tissue drug screen and file a CPS report if needs arise.  No Further Intervention Required/No Barriers to Discharge    Brant Peets, Student-SW 08/11/2015, 9:36 AM  

## 2015-08-11 NOTE — Progress Notes (Signed)
Subjective: Postpartum Day 2: Cesarean Delivery Patient reports tolerating PO, + BM and no problems voiding.    Objective: Vital signs in last 24 hours: Temp:  [97.8 F (36.6 C)-98.6 F (37 C)] 98.6 F (37 C) (02/16 0615) Pulse Rate:  [94] 94 (02/16 0615) Resp:  [16-18] 18 (02/16 0615) BP: (103-106)/(63-65) 106/65 mmHg (02/16 0615)  Physical Exam:  General: alert and cooperative Lochia: appropriate Uterine Fundus: firm Incision: healing well, fluid collection improving , only visible upon standing DVT Evaluation: No evidence of DVT seen on physical exam. Negative Homan's sign. No cords or calf tenderness. No significant calf/ankle edema.   Recent Labs  08/08/15 0925 08/10/15 0618  HGB 12.3 9.7*  HCT 35.6* 28.7*    Assessment/Plan: Status post Cesarean section. Doing well postoperatively.  Discharge home with standard precautions and return to clinic in 1-2 weeks.  Chan Rosasco G 08/11/2015, 8:23 AM

## 2015-08-11 NOTE — Lactation Note (Signed)
This note was copied from a baby's chart. Lactation Consultation Note  Patient Name: [name] [id number] Date: 08/11/2015 Reason for consult: Follow-up assessment   With this experienced breast feeding mom. Mom reports breast feeding going well. Breast care/enogrgement reviewed. Mom denies any questions/concerns at this time, but knows to call lactation as needed.    Maternal Data    Feeding Feeding Type: Breast Fed Length of feed: 15 min  LATCH Score/Interventions                      Lactation Tools Discussed/Used     Consult Status Consult Status: Complete Follow-up type: Call as needed    Tonna Corner 08/11/2015, 10:39 AM

## 2019-02-16 ENCOUNTER — Other Ambulatory Visit: Payer: Self-pay | Admitting: Obstetrics and Gynecology

## 2019-03-10 ENCOUNTER — Other Ambulatory Visit: Payer: Self-pay | Admitting: Obstetrics and Gynecology

## 2019-03-23 DIAGNOSIS — D219 Benign neoplasm of connective and other soft tissue, unspecified: Secondary | ICD-10-CM | POA: Diagnosis present

## 2019-03-23 NOTE — H&P (Signed)
[name] is a 38 y.o.  female, P: 2-0-1-2 presents for an abodminal myomectomy because of a large uterine fibroid. The patient has known of her fibroids for years but over the past year it has gotten progressively larger and more symptomatic. Her symptoms relate to the mass effect of the enlarged uterus: increased urinary frequency, feeling pregnant, crampiness with exercise and certain movements The patient was seen by another GYN practice and records report a single 13.9 cm fibroid that the patient states she was told was pedunculated. She has no bleeding issues due to her Mirena IUD reporting monthly spotting that lasts for 3 days and requires the use of #2 tampons a day for hygiene. She has no cramping with her "period". She goes on to deny dyspareunia, changes in bowel function or back pain. She does admits to an increase in vaginal discharge but no itching or vaginal odor. A review of both medical and surgical management options were given to the patient with the patient deciding to proceed with an abdominal myomectomy in order to preserve childbearing potential.  Past Medical History  OB History: G: 3;  P: 2-0-1-2; C-section: 2014 and 2017  GYN History: menarche: 38 YO    LMP: see HPI     Contraception: Mirena IUD;  Abnormal PAP Smear in 2004 treated with Cryotherapy-normal since;   Last PAP smear: 2020-normal  Medical History: Uterine Fibroid  Surgical History: 2004 Cryotherapy of Cervix;  2015 C-section and 2017 C-section Denies problems with anesthesia or history of blood transfusions  Family History: Dementia, Hypertension and Thyroid Disease  Social History: Single and employed as an Teacher, early years/pre;  Admits to occasional tobacco and alcohol use   Medications: Echinacea daily Vitamin D daily Multivitamin daily  Allergies  Allergen Reactions  . Amoxicillin Swelling    Has patient had a PCN reaction causing immediate rash, facial/tongue/throat swelling, SOB or  lightheadedness with hypotension: YES Has patient had a PCN reaction causing severe rash involving mucus membranes or skin necrosis: No Has patient had a PCN reaction that required hospitalization No Has patient had a PCN reaction occurring within the last 10 years: No If all of the above answers are "NO", then may proceed with Cephalosporin use.   Amoxicillin causes angioedema  Denies sensitivity to peanuts, shellfish, soy, latex or adhesives.   ROS: Admits to glasses, abdominal fullness, urinary frequency but  denies headache, vision changes, nasal congestion, dysphagia, tinnitus, dizziness, hoarseness, cough,  chest pain, shortness of breath, nausea, vomiting, diarrhea,constipation,  urgency  dysuria, hematuria, vaginitis symptoms, pelvic pain, swelling of joints,easy bruising,  myalgias, arthralgias, skin rashes, unexplained weight loss and except as is mentioned in the history of present illness, patient's review of systems is otherwise negative.     Physical Exam  Bp: 100/62  P: 60 bpm  O2Sat.: 97% (room air)  Weight: 159 lbs.  Height: 5' 5.5" BMI: 26.5  Neck: supple without masses or thyromegaly Lungs: clear to auscultation Heart: regular rate and rhythm Abdomen: firm mass from pelvis to #2 fingers below umbilicus, mildly  tender  Pelvic:EGBUS- wnl; vagina-normal rugae; uterus-20 weeks size, irregular and slightly mobile,  cervix without lesions or motion tenderness; no IUD string visible; adnexae-no tenderness or masses Extremities:  no clubbing, cyanosis or edema   Assesment: Large Uterine Fibroid   Disposition:  Reviewed the risks of surgery to include, but not limited to: reaction to anesthesia, damage to adjacent organs, infection, excessive bleeding. and if the endometrium is breached, the patient  will need to have a C-section delivery. The patient verbalized understanding of these risks and has consented to proceed with an Abdominal Myomectomy, Removal of IUD and Possible  Hysteroscopy at [facility] on April 01, 2019 at 9:30 a.m.   CSN# [id number]   Alesi Zachery J. Florene Glen, PA-C  for [employer].Mancel Bale

## 2019-03-27 ENCOUNTER — Encounter (HOSPITAL_COMMUNITY): Payer: Self-pay

## 2019-03-27 NOTE — Progress Notes (Signed)
[id number] [place], [place] - [address] [place] [address] Phone: [phone] Fax: [phone]      Your procedure is scheduled on April 01, 2019.  Report to [facility] Main Entrance "A" at 7:30 A.M., and check in at the Admitting office.  Call this number if you have problems the morning of surgery:  [phone]  Call [phone] if you have any questions prior to your surgery date Monday-Friday 8am-4pm    Remember:  Do not eat or drink after midnight the night before your surgery     Take these medicines the morning of surgery:  Tetrahydrozoline HCl (VISINE OP) - if needed for dry eyes  As of today, STOP taking any Aspirin (unless otherwise instructed by your surgeon), Aleve, Naproxen, Ibuprofen, Motrin, Advil, Goody's, BC's, all herbal medications, fish oil, and all vitamins.    The Morning of Surgery  Do not wear jewelry, make-up or nail polish.  Do not wear lotions, powders, or perfumes or deodorant  Do not shave 48 hours prior to surgery.    Do not bring valuables to the hospital.  [facility] is not responsible for any belongings or valuables.  If you are a smoker, DO NOT Smoke 24 hours prior to surgery IF you wear a CPAP at night please bring your mask, tubing, and machine the morning of surgery   Remember that you must have someone to transport you home after your surgery, and remain with you for 24 hours if you are discharged the same day.   Contacts, glasses, hearing aids, dentures or bridgework may not be worn into surgery.    Leave your suitcase in the car.  After surgery it may be brought to your room.  For patients admitted to the hospital, discharge time will be determined by your treatment team.  Patients discharged the day of surgery will not be allowed to drive home.    Special instructions:   [facility]- Preparing For Surgery  Before surgery, you can play an important role. Because skin is not  sterile, your skin needs to be as free of germs as possible. You can reduce the number of germs on your skin by washing with CHG (chlorahexidine gluconate) Soap before surgery.  CHG is an antiseptic cleaner which kills germs and bonds with the skin to continue killing germs even after washing.    Oral Hygiene is also important to reduce your risk of infection.  Remember - BRUSH YOUR TEETH THE MORNING OF SURGERY WITH YOUR REGULAR TOOTHPASTE  Please do not use if you have an allergy to CHG or antibacterial soaps. If your skin becomes reddened/irritated stop using the CHG.  Do not shave (including legs and underarms) for at least 48 hours prior to first CHG shower. It is OK to shave your face.  Please follow these instructions carefully.   1. Shower the NIGHT BEFORE SURGERY and the MORNING OF SURGERY with CHG Soap.   2. If you chose to wash your hair, wash your hair first as usual with your normal shampoo.  3. After you shampoo, rinse your hair and body thoroughly to remove the shampoo.  4. Use CHG as you would any other liquid soap. You can apply CHG directly to the skin and wash gently with a scrungie or a clean washcloth.   5. Apply the CHG Soap to your body ONLY FROM THE NECK DOWN.  Do not use on open wounds or open sores. Avoid contact with your eyes,  ears, mouth and genitals (private parts). Wash Face and genitals (private parts)  with your normal soap.   6. Wash thoroughly, paying special attention to the area where your surgery will be performed.  7. Thoroughly rinse your body with warm water from the neck down.  8. DO NOT shower/wash with your normal soap after using and rinsing off the CHG Soap.  9. Pat yourself dry with a CLEAN TOWEL.  10. Wear CLEAN PAJAMAS to bed the night before surgery, wear comfortable clothes the morning of surgery  11. Place CLEAN SHEETS on your bed the night of your first shower and DO NOT SLEEP WITH PETS.    Day of Surgery:  Do not apply any  deodorants/lotions. Please shower the morning of surgery with the CHG soap  Please wear clean clothes to the hospital/surgery center.   Remember to brush your teeth WITH YOUR REGULAR TOOTHPASTE.   Please read over the following fact sheets that you were given.

## 2019-03-30 ENCOUNTER — Other Ambulatory Visit (HOSPITAL_COMMUNITY)
Admission: RE | Admit: 2019-03-30 | Discharge: 2019-03-30 | Disposition: A | Discharge status: Home / Self Care | Payer: BC Managed Care – PPO | Source: Ambulatory Visit | Attending: Obstetrics and Gynecology | Admitting: Obstetrics and Gynecology

## 2019-03-30 ENCOUNTER — Other Ambulatory Visit: Payer: Self-pay

## 2019-03-30 ENCOUNTER — Encounter (HOSPITAL_COMMUNITY)
Admission: RE | Admit: 2019-03-30 | Discharge: 2019-03-30 | Disposition: A | Discharge status: Home / Self Care | Payer: BC Managed Care – PPO | Source: Ambulatory Visit | Attending: Obstetrics and Gynecology | Admitting: Obstetrics and Gynecology

## 2019-03-30 ENCOUNTER — Encounter (HOSPITAL_COMMUNITY): Payer: Self-pay

## 2019-03-30 DIAGNOSIS — Z20828 Contact with and (suspected) exposure to other viral communicable diseases: Secondary | ICD-10-CM | POA: Diagnosis not present

## 2019-03-30 DIAGNOSIS — Z01812 Encounter for preprocedural laboratory examination: Secondary | ICD-10-CM | POA: Insufficient documentation

## 2019-03-30 DIAGNOSIS — D259 Leiomyoma of uterus, unspecified: Secondary | ICD-10-CM | POA: Diagnosis not present

## 2019-03-30 LAB — CBC
HCT: 40.4 % (ref 36.0–46.0)
Hemoglobin: 13.3 g/dL (ref 12.0–15.0)
MCH: 31.5 pg (ref 26.0–34.0)
MCHC: 32.9 g/dL (ref 30.0–36.0)
MCV: 95.7 fL (ref 80.0–100.0)
Platelets: 262 10*3/uL (ref 150–400)
RBC: 4.22 MIL/uL (ref 3.87–5.11)
RDW: 11.9 % (ref 11.5–15.5)
WBC: 4.8 10*3/uL (ref 4.0–10.5)
nRBC: 0 % (ref 0.0–0.2)

## 2019-03-30 LAB — BASIC METABOLIC PANEL
Anion gap: 9 (ref 5–15)
BUN: 8 mg/dL (ref 6–20)
CO2: 23 mmol/L (ref 22–32)
Calcium: 8.5 mg/dL — ABNORMAL LOW (ref 8.9–10.3)
Chloride: 106 mmol/L (ref 98–111)
Creatinine, Ser: 0.71 mg/dL (ref 0.44–1.00)
GFR calc Af Amer: >60 mL/min (ref >60)
GFR calc non Af Amer: >60 mL/min (ref >60)
Glucose, Bld: 96 mg/dL (ref 70–99)
Potassium: 3.8 mmol/L (ref 3.5–5.1)
Sodium: 138 mmol/L (ref 135–145)

## 2019-03-30 LAB — TYPE AND SCREEN
ABO/RH(D): A POS
Antibody Screen: NEGATIVE

## 2019-03-30 LAB — SARS CORONAVIRUS 2 (TAT 6-24 HRS): SARS Coronavirus 2: NEGATIVE

## 2019-03-30 LAB — ABO/RH: ABO/RH(D): A POS

## 2019-03-30 NOTE — Progress Notes (Addendum)
  Coronavirus Screening Scheduled for Covid test today Have you experienced the following symptoms:  Cough yes/no: No Fever (>100.39F)  yes/no: No Runny nose yes/no: No Sore throat yes/no: No Difficulty breathing/shortness of breath  yes/no: No Loss of smell or taste-No Have you or a family member traveled in the last 14 days and where? yes/no: No  PCP -  Dr. Larina Earthly  Cardiologist - denies  Chest x-ray - NA  EKG - NA  Stress Test - denies  ECHO - denies  Cardiac Cath - denies  AICD-denies PM-denies LOOP-denies  Sleep Study - denies CPAP - NA  LABS-CBC, BMP, T/S  ASA-denies  ERAS-NA HA1C-denies Fasting Blood Sugar -  Checks Blood Sugar _____ times a day  Anesthesia-N  Pt denies having chest pain, sob, or fever at this time. All instructions explained to the pt, with a verbal understanding of the material. Pt agrees to go over the instructions while at home for a better understanding. Pt also instructed to self quarantine after being tested for COVID-19. The opportunity to ask questions was provided.

## 2019-04-01 ENCOUNTER — Other Ambulatory Visit: Payer: Self-pay

## 2019-04-01 ENCOUNTER — Inpatient Hospital Stay (HOSPITAL_COMMUNITY)
Admission: RE | Admit: 2019-04-01 | Discharge: 2019-04-02 | DRG: 743 | Disposition: A | Discharge status: Home / Self Care | Payer: BC Managed Care – PPO | Attending: Obstetrics and Gynecology | Admitting: Obstetrics and Gynecology

## 2019-04-01 ENCOUNTER — Inpatient Hospital Stay (HOSPITAL_COMMUNITY): Payer: BC Managed Care – PPO | Admitting: Certified Registered Nurse Anesthetist

## 2019-04-01 ENCOUNTER — Encounter (HOSPITAL_COMMUNITY): Payer: Self-pay

## 2019-04-01 ENCOUNTER — Encounter (HOSPITAL_COMMUNITY)
Admission: RE | Disposition: A | Discharge status: Home / Self Care | Payer: Self-pay | Source: Home / Self Care | Attending: Obstetrics and Gynecology

## 2019-04-01 DIAGNOSIS — Z881 Allergy status to other antibiotic agents status: Secondary | ICD-10-CM

## 2019-04-01 DIAGNOSIS — D259 Leiomyoma of uterus, unspecified: Secondary | ICD-10-CM | POA: Diagnosis present

## 2019-04-01 DIAGNOSIS — Z8249 Family history of ischemic heart disease and other diseases of the circulatory system: Secondary | ICD-10-CM

## 2019-04-01 DIAGNOSIS — D219 Benign neoplasm of connective and other soft tissue, unspecified: Secondary | ICD-10-CM | POA: Diagnosis present

## 2019-04-01 HISTORY — PX: IUD REMOVAL: SHX5392

## 2019-04-01 HISTORY — PX: MYOMECTOMY: SHX85

## 2019-04-01 HISTORY — DX: Abnormal uterine and vaginal bleeding, unspecified: N93.9

## 2019-04-01 HISTORY — PX: HYSTEROSCOPY WITH RESECTOSCOPE: SHX5395

## 2019-04-01 HISTORY — DX: Benign neoplasm of connective and other soft tissue, unspecified: D21.9

## 2019-04-01 LAB — POCT PREGNANCY, URINE: Preg Test, Ur: NEGATIVE

## 2019-04-01 SURGERY — MYOMECTOMY, ABDOMINAL APPROACH
Anesthesia: General | Site: Vagina

## 2019-04-01 MED ORDER — SUCCINYLCHOLINE CHLORIDE 20 MG/ML IJ SOLN
INTRAMUSCULAR | Status: DC | PRN
  Administered 2019-04-01: 80 mg via INTRAVENOUS

## 2019-04-01 MED ORDER — DEXAMETHASONE SODIUM PHOSPHATE 10 MG/ML IJ SOLN
INTRAMUSCULAR | Status: AC
  Filled 2019-04-01: qty 1

## 2019-04-01 MED ORDER — ONDANSETRON HCL 4 MG/2ML IJ SOLN
INTRAMUSCULAR | Status: AC
  Filled 2019-04-01: qty 2

## 2019-04-01 MED ORDER — VASOPRESSIN 20 UNIT/ML IV SOLN
INTRAVENOUS | Status: DC | PRN
  Administered 2019-04-01: 10:00:00 10 mL via INTRAMUSCULAR

## 2019-04-01 MED ORDER — LIDOCAINE 2% (20 MG/ML) 5 ML SYRINGE
INTRAMUSCULAR | Status: AC
  Filled 2019-04-01: qty 5

## 2019-04-01 MED ORDER — SODIUM CHLORIDE 0.9 % IV SOLN
INTRAVENOUS | Status: DC | PRN
  Administered 2019-04-01: 25 ug/min via INTRAVENOUS

## 2019-04-01 MED ORDER — LACTATED RINGERS IV SOLN
INTRAVENOUS | Status: DC
  Administered 2019-04-01: 09:00:00 via INTRAVENOUS

## 2019-04-01 MED ORDER — PROPOFOL 10 MG/ML IV BOLUS
INTRAVENOUS | Status: DC | PRN
  Administered 2019-04-01: 130 mg via INTRAVENOUS

## 2019-04-01 MED ORDER — SUCCINYLCHOLINE CHLORIDE 200 MG/10ML IV SOSY
PREFILLED_SYRINGE | INTRAVENOUS | Status: AC
  Filled 2019-04-01: qty 10

## 2019-04-01 MED ORDER — PHENYLEPHRINE HCL (PRESSORS) 10 MG/ML IV SOLN
INTRAVENOUS | Status: DC | PRN
  Administered 2019-04-01 (×2): 40 ug via INTRAVENOUS
  Administered 2019-04-01: 80 ug via INTRAVENOUS

## 2019-04-01 MED ORDER — KETOROLAC TROMETHAMINE 30 MG/ML IJ SOLN: 30.0000 mg | Freq: Once | INTRAMUSCULAR | Status: DC

## 2019-04-01 MED ORDER — DEXAMETHASONE SODIUM PHOSPHATE 10 MG/ML IJ SOLN
INTRAMUSCULAR | Status: DC | PRN
  Administered 2019-04-01: 50 mg via INTRAVENOUS
  Administered 2019-04-01: 20 mg via INTRAVENOUS

## 2019-04-01 MED ORDER — VASOPRESSIN 20 UNIT/ML IV SOLN
INTRAVENOUS | Status: AC
  Filled 2019-04-01: qty 1

## 2019-04-01 MED ORDER — CLINDAMYCIN PHOSPHATE 900 MG/50ML IV SOLN
INTRAVENOUS | Status: AC
  Filled 2019-04-01: qty 50

## 2019-04-01 MED ORDER — DOCUSATE SODIUM 100 MG PO CAPS
100.0000 mg | ORAL_CAPSULE | Freq: Two times a day (BID) | ORAL | Status: DC
  Administered 2019-04-01 – 2019-04-02 (×3): 100 mg via ORAL
  Filled 2019-04-01 (×3): qty 1

## 2019-04-01 MED ORDER — ONDANSETRON HCL 4 MG PO TABS: 4.0000 mg | ORAL_TABLET | Freq: Four times a day (QID) | ORAL | Status: DC | PRN

## 2019-04-01 MED ORDER — ACETAMINOPHEN 500 MG PO TABS
1000.0000 mg | ORAL_TABLET | Freq: Once | ORAL | Status: AC
  Administered 2019-04-01: 09:00:00 1000 mg via ORAL

## 2019-04-01 MED ORDER — DIPHENHYDRAMINE HCL 12.5 MG/5ML PO ELIX: 12.5000 mg | ORAL_SOLUTION | Freq: Four times a day (QID) | ORAL | Status: DC | PRN

## 2019-04-01 MED ORDER — CLINDAMYCIN PHOSPHATE 900 MG/50ML IV SOLN
900.0000 mg | INTRAVENOUS | Status: AC
  Administered 2019-04-01: 900 mg via INTRAVENOUS

## 2019-04-01 MED ORDER — PHENYLEPHRINE 40 MCG/ML (10ML) SYRINGE FOR IV PUSH (FOR BLOOD PRESSURE SUPPORT)
PREFILLED_SYRINGE | INTRAVENOUS | Status: AC
  Filled 2019-04-01: qty 10

## 2019-04-01 MED ORDER — GENTAMICIN SULFATE 40 MG/ML IJ SOLN: 5.0000 mg/kg | INTRAVENOUS | Status: DC

## 2019-04-01 MED ORDER — LIDOCAINE 2% (20 MG/ML) 5 ML SYRINGE
INTRAMUSCULAR | Status: DC | PRN
  Administered 2019-04-01: 60 mg via INTRAVENOUS

## 2019-04-01 MED ORDER — SODIUM CHLORIDE (PF) 0.9 % IJ SOLN
INTRAMUSCULAR | Status: AC
  Filled 2019-04-01: qty 50

## 2019-04-01 MED ORDER — HYDROMORPHONE 1 MG/ML IV SOLN
INTRAVENOUS | Status: DC
  Administered 2019-04-01: 0.5 mg via INTRAVENOUS
  Administered 2019-04-01: 30 mg via INTRAVENOUS
  Administered 2019-04-02 (×2): 0 mg via INTRAVENOUS
  Administered 2019-04-02: 0.3 mg via INTRAVENOUS
  Filled 2019-04-01: qty 30

## 2019-04-01 MED ORDER — DIPHENHYDRAMINE HCL 50 MG/ML IJ SOLN: 12.5000 mg | Freq: Four times a day (QID) | INTRAMUSCULAR | Status: DC | PRN

## 2019-04-01 MED ORDER — ONDANSETRON HCL 4 MG/2ML IJ SOLN
INTRAMUSCULAR | Status: DC | PRN
  Administered 2019-04-01: 4 mg via INTRAVENOUS

## 2019-04-01 MED ORDER — MIDAZOLAM HCL 5 MG/5ML IJ SOLN
INTRAMUSCULAR | Status: DC | PRN
  Administered 2019-04-01: 2 mg via INTRAVENOUS

## 2019-04-01 MED ORDER — FENTANYL CITRATE (PF) 250 MCG/5ML IJ SOLN
INTRAMUSCULAR | Status: DC | PRN
  Administered 2019-04-01: 125 ug via INTRAVENOUS
  Administered 2019-04-01: 50 ug via INTRAVENOUS

## 2019-04-01 MED ORDER — LIDOCAINE HCL 2 % IJ SOLN
INTRAMUSCULAR | Status: AC
  Filled 2019-04-01: qty 20

## 2019-04-01 MED ORDER — NALOXONE HCL 0.4 MG/ML IJ SOLN: 0.4000 mg | INTRAMUSCULAR | Status: DC | PRN

## 2019-04-01 MED ORDER — CHLORHEXIDINE GLUCONATE CLOTH 2 % EX PADS: 6.0000 | MEDICATED_PAD | Freq: Every day | CUTANEOUS | Status: DC

## 2019-04-01 MED ORDER — ACETAMINOPHEN 500 MG PO TABS
ORAL_TABLET | ORAL | Status: AC
  Administered 2019-04-01: 1000 mg via ORAL
  Filled 2019-04-01: qty 2

## 2019-04-01 MED ORDER — FENTANYL CITRATE (PF) 250 MCG/5ML IJ SOLN
INTRAMUSCULAR | Status: AC
  Filled 2019-04-01: qty 5

## 2019-04-01 MED ORDER — KETOROLAC TROMETHAMINE 30 MG/ML IJ SOLN
30.0000 mg | Freq: Four times a day (QID) | INTRAMUSCULAR | Status: DC
  Administered 2019-04-01 – 2019-04-02 (×4): 30 mg via INTRAVENOUS
  Filled 2019-04-01 (×4): qty 1

## 2019-04-01 MED ORDER — SODIUM CHLORIDE 0.9 % IR SOLN
Status: DC | PRN
  Administered 2019-04-01: 2000 mL

## 2019-04-01 MED ORDER — SIMETHICONE 80 MG PO CHEW: 80.0000 mg | CHEWABLE_TABLET | Freq: Four times a day (QID) | ORAL | Status: DC | PRN

## 2019-04-01 MED ORDER — MIDAZOLAM HCL 2 MG/2ML IJ SOLN
INTRAMUSCULAR | Status: AC
  Filled 2019-04-01: qty 2

## 2019-04-01 MED ORDER — LACTATED RINGERS IV SOLN
INTRAVENOUS | Status: DC
  Administered 2019-04-01 – 2019-04-02 (×3): via INTRAVENOUS

## 2019-04-01 MED ORDER — SODIUM CHLORIDE 0.9% FLUSH: 9.0000 mL | INTRAVENOUS | Status: DC | PRN

## 2019-04-01 MED ORDER — OXYCODONE-ACETAMINOPHEN 5-325 MG PO TABS: 1.0000 | ORAL_TABLET | Freq: Four times a day (QID) | ORAL | Status: DC | PRN

## 2019-04-01 MED ORDER — ONDANSETRON HCL 4 MG/2ML IJ SOLN: 4.0000 mg | Freq: Four times a day (QID) | INTRAMUSCULAR | Status: DC | PRN

## 2019-04-01 MED ORDER — LIDOCAINE HCL 1 % IJ SOLN
INTRAMUSCULAR | Status: AC
  Filled 2019-04-01: qty 20

## 2019-04-01 MED ORDER — HYDROMORPHONE HCL 1 MG/ML IJ SOLN
INTRAMUSCULAR | Status: AC
  Filled 2019-04-01: qty 1

## 2019-04-01 MED ORDER — PROPOFOL 10 MG/ML IV BOLUS
INTRAVENOUS | Status: AC
  Filled 2019-04-01: qty 40

## 2019-04-01 MED ORDER — MENTHOL 3 MG MT LOZG: 1.0000 | LOZENGE | OROMUCOSAL | Status: DC | PRN

## 2019-04-01 MED ORDER — LACTATED RINGERS IV SOLN
INTRAVENOUS | Status: DC | PRN
  Administered 2019-04-01: 10:00:00 via INTRAVENOUS

## 2019-04-01 MED ORDER — ROCURONIUM BROMIDE 10 MG/ML (PF) SYRINGE
PREFILLED_SYRINGE | INTRAVENOUS | Status: AC
  Filled 2019-04-01: qty 10

## 2019-04-01 MED ORDER — HYDROMORPHONE HCL 1 MG/ML IJ SOLN
0.2500 mg | INTRAMUSCULAR | Status: DC | PRN
  Administered 2019-04-01 (×2): 0.5 mg via INTRAVENOUS

## 2019-04-01 MED ORDER — IBUPROFEN 600 MG PO TABS
600.0000 mg | ORAL_TABLET | Freq: Four times a day (QID) | ORAL | Status: DC
  Administered 2019-04-02: 600 mg via ORAL
  Filled 2019-04-01: qty 1

## 2019-04-01 MED ORDER — SUGAMMADEX SODIUM 200 MG/2ML IV SOLN
INTRAVENOUS | Status: DC | PRN
  Administered 2019-04-01: 148.4 mg via INTRAVENOUS

## 2019-04-01 SURGICAL SUPPLY — 58 items
BARRIER ADHS 3X4 INTERCEED (GAUZE/BANDAGES/DRESSINGS) ×4 IMPLANT
BENZOIN TINCTURE PRP APPL 2/3 (GAUZE/BANDAGES/DRESSINGS) ×4 IMPLANT
BIPOLAR CUTTING LOOP 21FR (ELECTRODE)
CANISTER SUCT 3000ML PPV (MISCELLANEOUS) ×4 IMPLANT
CATH ROBINSON RED A/P 16FR (CATHETERS) ×4 IMPLANT
CLOSURE WOUND 1/2 X4 (GAUZE/BANDAGES/DRESSINGS) ×1
CLOTH BEACON ORANGE TIMEOUT ST (SAFETY) ×4 IMPLANT
COVER WAND RF STERILE (DRAPES) ×4 IMPLANT
DECANTER SPIKE VIAL GLASS SM (MISCELLANEOUS) ×4 IMPLANT
DRSG OPSITE POSTOP 4X10 (GAUZE/BANDAGES/DRESSINGS) ×4 IMPLANT
DURAPREP 26ML APPLICATOR (WOUND CARE) ×4 IMPLANT
ELECT CAUTERY BLADE 6.4 (BLADE) IMPLANT
ELECT NEEDLE TIP 2.8 STRL (NEEDLE) ×4 IMPLANT
ELECT REM PT RETURN 9FT ADLT (ELECTROSURGICAL) ×4
ELECTRODE REM PT RTRN 9FT ADLT (ELECTROSURGICAL) ×2 IMPLANT
GLOVE BIO SURGEON STRL SZ7.5 (GLOVE) ×4 IMPLANT
GLOVE BIOGEL PI IND STRL 6.5 (GLOVE) ×2 IMPLANT
GLOVE BIOGEL PI IND STRL 7.0 (GLOVE) ×4 IMPLANT
GLOVE BIOGEL PI IND STRL 7.5 (GLOVE) ×2 IMPLANT
GLOVE BIOGEL PI INDICATOR 6.5 (GLOVE) ×2
GLOVE BIOGEL PI INDICATOR 7.0 (GLOVE) ×4
GLOVE BIOGEL PI INDICATOR 7.5 (GLOVE) ×2
GOWN STRL REUS W/ TWL LRG LVL3 (GOWN DISPOSABLE) ×6 IMPLANT
GOWN STRL REUS W/TWL LRG LVL3 (GOWN DISPOSABLE) ×6
HEMOSTAT SURGICEL 2X14 (HEMOSTASIS) IMPLANT
HIBICLENS CHG 4% 4OZ BTL (MISCELLANEOUS) ×4 IMPLANT
KIT PROCEDURE FLUENT (KITS) ×4 IMPLANT
KIT TURNOVER KIT B (KITS) ×4 IMPLANT
LOOP CUTTING BIPOLAR 21FR (ELECTRODE) IMPLANT
NEEDLE HYPO 22GX1.5 SAFETY (NEEDLE) ×4 IMPLANT
NS IRRIG 1000ML POUR BTL (IV SOLUTION) ×4 IMPLANT
PACK ABDOMINAL GYN (CUSTOM PROCEDURE TRAY) ×4 IMPLANT
PACK VAGINAL MINOR WOMEN LF (CUSTOM PROCEDURE TRAY) ×4 IMPLANT
PAD ARMBOARD 7.5X6 YLW CONV (MISCELLANEOUS) ×4 IMPLANT
PAD OB MATERNITY 4.3X12.25 (PERSONAL CARE ITEMS) ×4 IMPLANT
SPECIMEN JAR MEDIUM (MISCELLANEOUS) ×4 IMPLANT
SPONGE SURGIFOAM ABS GEL 12-7 (HEMOSTASIS) IMPLANT
STAPLER VISISTAT 35W (STAPLE) IMPLANT
STRIP CLOSURE SKIN 1/2X4 (GAUZE/BANDAGES/DRESSINGS) ×3 IMPLANT
SUT CHROMIC 2 0 CT 1 (SUTURE) ×4 IMPLANT
SUT MNCRL AB 3-0 PS2 27 (SUTURE) IMPLANT
SUT PDS AB 1 CTX 36 (SUTURE) IMPLANT
SUT PLAIN 2 0 XLH (SUTURE) IMPLANT
SUT VIC AB 0 CT1 18XCR BRD8 (SUTURE) ×2 IMPLANT
SUT VIC AB 0 CT1 27 (SUTURE) ×8
SUT VIC AB 0 CT1 27XBRD ANBCTR (SUTURE) ×8 IMPLANT
SUT VIC AB 0 CT1 8-18 (SUTURE) ×2
SUT VIC AB 0 CTX 36 (SUTURE)
SUT VIC AB 0 CTX36XBRD ANBCTRL (SUTURE) IMPLANT
SUT VIC AB 2-0 CT1 27 (SUTURE)
SUT VIC AB 2-0 CT1 TAPERPNT 27 (SUTURE) IMPLANT
SUT VIC AB 2-0 SH 27 (SUTURE)
SUT VIC AB 2-0 SH 27XBRD (SUTURE) IMPLANT
SUT VIC AB 3-0 SH 27 (SUTURE) ×4
SUT VIC AB 3-0 SH 27X BRD (SUTURE) ×4 IMPLANT
SYR CONTROL 10ML LL (SYRINGE) ×4 IMPLANT
TOWEL GREEN STERILE FF (TOWEL DISPOSABLE) ×8 IMPLANT
TRAY FOLEY W/BAG SLVR 14FR (SET/KITS/TRAYS/PACK) ×4 IMPLANT

## 2019-04-01 NOTE — Anesthesia Postprocedure Evaluation (Signed)
Anesthesia Post Note  Patient: [name]  Procedure(s) Performed: ABDOMINAL MYOMECTOMY (N/A Abdomen) INTRAUTERINE DEVICE (IUD) REMOVAL (N/A Vagina ) POSSIBLE HYSTEROSCOPY WITH RESECTOSCOPE TO REMOVE IUD (N/A Vagina )     Patient location during evaluation: PACU Anesthesia Type: General Level of consciousness: awake and alert Pain management: pain level controlled Vital Signs Assessment: post-procedure vital signs reviewed and stable Respiratory status: spontaneous breathing, nonlabored ventilation and respiratory function stable Cardiovascular status: blood pressure returned to baseline and stable Postop Assessment: no apparent nausea or vomiting Anesthetic complications: no    Last Vitals:  Vitals:   04/01/19 1210 04/01/19 1225  BP: 129/76 122/84  Pulse: 86 81  Resp: 12 15  Temp: (!) 36.3 C   SpO2: 100% 100%    Last Pain:  Vitals:   04/01/19 1238  PainSc: Asleep                 Jonerik Sliker,W. EDMOND

## 2019-04-01 NOTE — Transfer of Care (Signed)
Immediate Anesthesia Transfer of Care Note  Patient: [name]  Procedure(s) Performed: ABDOMINAL MYOMECTOMY (N/A Abdomen) INTRAUTERINE DEVICE (IUD) REMOVAL (N/A Vagina ) POSSIBLE HYSTEROSCOPY WITH RESECTOSCOPE TO REMOVE IUD (N/A Vagina )  Patient Location: PACU  Anesthesia Type:General  Level of Consciousness: awake, alert , patient cooperative and responds to stimulation  Airway & Oxygen Therapy: Patient Spontanous Breathing and Patient connected to face mask oxygen  Post-op Assessment: Report given to RN, Post -op Vital signs reviewed and stable and Patient moving all extremities X 4  Post vital signs: Reviewed and stable  Last Vitals:  Vitals Value Taken Time  BP    Temp    Pulse 81 04/01/19 1208  Resp 10 04/01/19 1208  SpO2 100 % 04/01/19 1208  Vitals shown include unvalidated device data.  Last Pain:  Vitals:   04/01/19 0824  PainSc: 0-No pain         Complications: No apparent anesthesia complications

## 2019-04-01 NOTE — Anesthesia Procedure Notes (Signed)
Procedure Name: Intubation Date/Time: 04/01/2019 9:49 AM Performed by: Glynda Jaeger, CRNA Pre-anesthesia Checklist: Patient identified, Patient being monitored, Timeout performed, Emergency Drugs available and Suction available Patient Re-evaluated:Patient Re-evaluated prior to induction Oxygen Delivery Method: Circle System Utilized Preoxygenation: Pre-oxygenation with 100% oxygen Induction Type: IV induction Ventilation: Mask ventilation without difficulty Laryngoscope Size: Mac and 4 Grade View: Grade I Tube type: Oral Tube size: 7.5 mm Number of attempts: 1 Airway Equipment and Method: Stylet Placement Confirmation: ETT inserted through vocal cords under direct vision,  positive ETCO2 and breath sounds checked- equal and bilateral Secured at: 22 cm Tube secured with: Tape Dental Injury: Teeth and Oropharynx as per pre-operative assessment

## 2019-04-01 NOTE — Progress Notes (Signed)
Pt arrived at 6N05, AOx4, VSS,  no reports of pain, oriented to room, call bell. Phone, use of bed cotrols. Will continue to monitor.

## 2019-04-01 NOTE — Discharge Instructions (Signed)
Call [facility] @ [phone] if:  You have a temperature greater than or equal to 100.4 degrees Farenheit orally You have pain that is not made better by the pain medication given and taken as directed You have excessive bleeding or problems urinating  Take Colace (Docusate Sodium/Stool Softener) 100 mg 2-3 times daily while taking narcotic pain medicine to avoid constipation or until bowel movements are regular. Take Ibuprofen 600 mg with food every 6 hours for  5 days then as needed for pain  You may drive after 3  weeks You may walk up steps  You may shower  You may resume a regular diet  Keep incisions clean and dry Do not lift over 15 pounds for 6 weeks Avoid anything in vagina for 6 weeks (or until after your post-operative visit)

## 2019-04-01 NOTE — Op Note (Signed)
Preop Diagnosis: Uterine Fibroids   Postop Diagnosis: Uterine fibroids   Procedure: ABDOMINAL MYOMECTOMY INTRAUTERINE DEVICE (IUD) REMOVAL   Anesthesia: General   Anesthesiologist: Roderic Palau, MD   Attending: Everett Graff, MD   Assistant: E. Florene Glen, PA-C  Findings: 15cm soft questionably necrotic fibroid  Pathology: Fibroid and excess myometrium with serosa  Fluids: 1000 cc  UOP: 35 cc  EBL: 50 cc  Complications: None  Procedure: The patient was taken to the operating room after the risks, benefits and alternatives were discussed with the patient. The patient verbalized understanding and consent signed and witnessed. The patient was placed under general anesthesia per anesthesiologist and prepped and draped in the normal sterile fashion.  Time Out was performed per protocol.  A pfannenstiel skin incision was made and carried down to the underlying fascia.  The fascial incision was extended bilaterally with the Mayo scissors.  The inferior aspect of the fascial incision was grasped with Kocher clamps and the fascia excised from the rectus muscle.  The same was done on the superior aspect of the fascial incision but it was much more dense in this area.  The muscle was separated in the midline and the peritoneum was inadvertently entered once getting through the dense scar tissue and extended with the bovie.  A towel clamp was placed on the fundal fibroid and the uterus elevated up through the incision.  Vasopressin at a concentration of 20 units of vasopressin in 100cc of normal saline was injected into the fundal fibroid and the fibroid shelled out using a hemostat and bovie for cautery.  There was just that one fibroid noted.  The bed was repaired with 2-0 vicryl and the serosa was reapproximated with 3-0 vicryl.  Copious irrigation was performed.  Intercede was placed on the uterine incision.  One incision was in the right lateral aspect of the incision.  The  endometrial cavity was not entered.  The fascia was repaired with 0 vicryl.  The subcutaneous tissue was irrigated and made hemostatic with the bovie.  The skin was reapproximated with 3-0 monocryl and steri strips with benzoin applied.  Honeycomb dressing placed.  The patient tolerated the procedure well and was returned to the recovery room in good condition.

## 2019-04-01 NOTE — Anesthesia Preprocedure Evaluation (Addendum)
Anesthesia Evaluation  Patient identified by MRN, date of birth, ID band Patient awake    Reviewed: Allergy & Precautions, H&P , NPO status , Patient's Chart, lab work & pertinent test results  Airway Mallampati: II  TM Distance: >3 FB Neck ROM: Full    Dental no notable dental hx. (+) Teeth Intact, Dental Advisory Given   Pulmonary asthma , Current Smoker and Patient abstained from smoking.,    Pulmonary exam normal breath sounds clear to auscultation       Cardiovascular negative cardio ROS   Rhythm:Regular Rate:Normal     Neuro/Psych negative neurological ROS  negative psych ROS   GI/Hepatic negative GI ROS, Neg liver ROS,   Endo/Other  negative endocrine ROS  Renal/GU negative Renal ROS  negative genitourinary   Musculoskeletal   Abdominal   Peds  Hematology negative hematology ROS (+)   Anesthesia Other Findings   Reproductive/Obstetrics negative OB ROS                            Anesthesia Physical Anesthesia Plan  ASA: II  Anesthesia Plan: General   Post-op Pain Management:    Induction: Intravenous  PONV Risk Score and Plan: 3 and Ondansetron, Dexamethasone and Midazolam  Airway Management Planned: Oral ETT  Additional Equipment:   Intra-op Plan:   Post-operative Plan: Extubation in OR  Informed Consent: I have reviewed the patients History and Physical, chart, labs and discussed the procedure including the risks, benefits and alternatives for the proposed anesthesia with the patient or authorized representative who has indicated his/her understanding and acceptance.     Dental advisory given  Plan Discussed with: CRNA  Anesthesia Plan Comments:         Anesthesia Quick Evaluation

## 2019-04-01 NOTE — Plan of Care (Signed)
  Problem: Clinical Measurements: Goal: Ability to maintain clinical measurements within normal limits will improve Outcome: Progressing Goal: Postoperative complications will be avoided or minimized Outcome: Progressing   

## 2019-04-02 ENCOUNTER — Encounter (HOSPITAL_COMMUNITY): Payer: Self-pay | Admitting: Obstetrics and Gynecology

## 2019-04-02 LAB — BASIC METABOLIC PANEL
Anion gap: 6 (ref 5–15)
BUN: 10 mg/dL (ref 6–20)
CO2: 24 mmol/L (ref 22–32)
Calcium: 8.4 mg/dL — ABNORMAL LOW (ref 8.9–10.3)
Chloride: 108 mmol/L (ref 98–111)
Creatinine, Ser: 0.76 mg/dL (ref 0.44–1.00)
GFR calc Af Amer: >60 mL/min (ref >60)
GFR calc non Af Amer: >60 mL/min (ref >60)
Glucose, Bld: 112 mg/dL — ABNORMAL HIGH (ref 70–99)
Potassium: 3.5 mmol/L (ref 3.5–5.1)
Sodium: 138 mmol/L (ref 135–145)

## 2019-04-02 LAB — CBC
HCT: 34.8 % — ABNORMAL LOW (ref 36.0–46.0)
Hemoglobin: 12 g/dL (ref 12.0–15.0)
MCH: 32.3 pg (ref 26.0–34.0)
MCHC: 34.5 g/dL (ref 30.0–36.0)
MCV: 93.8 fL (ref 80.0–100.0)
Platelets: 221 10*3/uL (ref 150–400)
RBC: 3.71 MIL/uL — ABNORMAL LOW (ref 3.87–5.11)
RDW: 11.9 % (ref 11.5–15.5)
WBC: 10.6 10*3/uL — ABNORMAL HIGH (ref 4.0–10.5)
nRBC: 0 % (ref 0.0–0.2)

## 2019-04-02 MED ORDER — OXYCODONE-ACETAMINOPHEN 5-325 MG PO TABS: ORAL_TABLET | ORAL | 0 refills | Status: DC

## 2019-04-02 MED ORDER — IBUPROFEN 600 MG PO TABS: ORAL_TABLET | ORAL | 1 refills | Status: AC

## 2019-04-02 NOTE — Progress Notes (Signed)
Discharged pt to home, instructions given and explained. 

## 2019-04-02 NOTE — Discharge Summary (Signed)
Physician Discharge Summary  Patient ID: [name] MRN: [id number] DOB/AGE: 39/20/1982 39 y.o.  Admit date: 04/01/2019 Discharge date: 04/02/2019  Admission Diagnoses: Fibroid IUD in situ  Discharge Diagnoses:  Active Problems:   Fibroids   Fibroid, uterine s/p IUD removal  Discharged Condition: good  Hospital Course: Pt admitted for scheduled abdominal myomectomy and removal of IUD. Surgery performed without complication.  POD 1 pt doing well and requesting discharge.  RN requested to replace honeycomb with fresh dressing prior to discharge.  Discharge instructions reviewed with the patient and questions answered.  Consults: None  Significant Diagnostic Studies: labs: wnl  Treatments: surgery: s/p abdominal myomectomy and removal of IUD  Discharge Exam: Blood pressure 121/78, pulse 86, temperature 98.5 F (36.9 C), temperature source Oral, resp. rate 17, last menstrual period 03/07/2019, SpO2 100 %, unknown if currently breastfeeding. General appearance: alert and no distress Resp: clear to auscultation bilaterally Cardio: regular rate and rhythm GI: soft, minimal tenderness, NABS, incision with dressing in place, ND Extremities: no calf tenderness Incision/Wound:dressing in place with stable staining  Disposition: Discharge disposition: 01-Home or Self Care        Allergies as of 04/02/2019      Reactions   Amoxicillin Swelling   Did it involve swelling of the face/tongue/throat, SOB, or low BP? Yes Did it involve sudden or severe rash/hives, skin peeling, or any reaction on the inside of your mouth or nose? No Did you need to seek medical attention at a hospital or doctor's office? Yes When did it last happen?about 20 years ago If all above answers are "NO", may proceed with cephalosporin use.      Medication List    STOP taking these medications   MIDOL PO     TAKE these medications   ECHINACEA PO Take 1 tablet by mouth daily.   ibuprofen 600  MG tablet Commonly known as: ADVIL Take 1 table po pc every 6 hours for 5 days then as needed for pain Notes to patient: Last given today at 11:20am   multivitamin with minerals Tabs tablet Take 1 tablet by mouth daily.   oxyCODONE-acetaminophen 5-325 MG tablet Commonly known as: PERCOCET/ROXICET Take 1 tablet every 6 hours prn for breakthrough post operative pain   VISINE OP Place 1 drop into both eyes daily as needed (dry eyes).   VITAMIN C PO Take 1 tablet by mouth daily.      Follow-up Information    Everett Graff, MD On 05/11/2019.   Specialty: Obstetrics and Gynecology Why: appointment time is 10 am Contact information: [place] [place] [address] [phone]           Signed: Delice Lesch 04/02/2019, 1:38 PM

## 2019-04-02 NOTE — Progress Notes (Signed)
Maricela Alling is a83 y.o.  [id number]  Post Op Date # 1: Abdominal Myomectomy/Removal of IUD  Subjective: Patient is Doing well postoperatively. Patient has Pain is controlled with current analgesics. Medications being used: prescription NSAID's including Ketorolac 30 mg IV and narcotic analgesics including PCA Dilaudid. Ambulating and voiding  without difficulty and tolerating a regular diet.    Objective: Vital signs in last 24 hours: Temp:  [97.3 F (36.3 C)-99.2 F (37.3 C)] 99.2 F (37.3 C) (10/08 0618) Pulse Rate:  [64-91] 72 (10/08 0618) Resp:  [12-20] 18 (10/08 0618) BP: (111-134)/(71-86) 116/71 (10/08 0618) SpO2:  [98 %-100 %] 100 % (10/08 0618)  Intake/Output from previous day: 10/07 0701 - 10/08 0700 In: 1676.5 [P.O.:720; I.V.:906.5] Out: 75 [Urine:25] Intake/Output this shift: No intake/output data recorded. Recent Labs  Lab 03/30/19 1025 04/02/19 0133  WBC 4.8 10.6*  HGB 13.3 12.0  HCT 40.4 34.8*  PLT 262 221     Recent Labs  Lab 03/30/19 1025 04/02/19 0133  NA 138 138  K 3.8 3.5  CL 106 108  CO2 23 24  BUN 8 10  CREATININE 0.71 0.76  CALCIUM 8.5* 8.4*  GLUCOSE 96 112*    EXAM: General: alert, cooperative and no distress Resp: clear to auscultation bilaterally Cardio: regular rate and rhythm, S1, S2 normal, no murmur, click, rub or gallop GI: bowel sounds present, wound dressing dry and intact with a 2 x 1.5 cm dry stain centrally. Extremities: Homans sign is negative, no sign of DVT and SCD hose in place anf functioning; no calf tenderness. Vaginal Bleeding: none   Assessment: s/p Procedure(s): ABDOMINAL MYOMECTOMY INTRAUTERINE DEVICE (IUD) REMOVAL POSSIBLE HYSTEROSCOPY WITH RESECTOSCOPE TO REMOVE IUD: stable, progressing well and tolerating diet  Plan: Encourage ambulation Advance to PO medication Will consider discharge home today.  LOS: 1 day    Earnstine Regal, PA-C 04/02/2019 7:30 AM

## 2019-04-03 LAB — SURGICAL PATHOLOGY

## 2019-05-19 ENCOUNTER — Ambulatory Visit
Admission: EM | Admit: 2019-05-19 | Discharge: 2019-05-19 | Disposition: A | Discharge status: Home / Self Care | Payer: BC Managed Care – PPO | Attending: Physician Assistant | Admitting: Physician Assistant

## 2019-05-19 ENCOUNTER — Other Ambulatory Visit: Payer: Self-pay

## 2019-05-19 DIAGNOSIS — R05 Cough: Secondary | ICD-10-CM

## 2019-05-19 DIAGNOSIS — F17210 Nicotine dependence, cigarettes, uncomplicated: Secondary | ICD-10-CM | POA: Diagnosis not present

## 2019-05-19 DIAGNOSIS — Z20828 Contact with and (suspected) exposure to other viral communicable diseases: Secondary | ICD-10-CM | POA: Diagnosis not present

## 2019-05-19 DIAGNOSIS — R059 Cough, unspecified: Secondary | ICD-10-CM

## 2019-05-19 NOTE — ED Triage Notes (Signed)
Pt c/o cough and congestion x2 days, wants a covid test

## 2019-05-19 NOTE — ED Provider Notes (Signed)
[name]    CSN: [id number] Arrival date & time: 05/19/19  1101      History   Chief Complaint Chief Complaint  Patient presents with  . Cough    HPI [name] is a 38 y.o. female.   38 year old female comes in for 2 day history of URI symptoms. Has had cough and congestion. Denies sore throat. Denies fever, chills, body aches. Denies abdominal pain, nausea, vomiting, diarrhea. Denies shortness of breath, loss of taste/smell. Current some day smoker. otc medicine with some relief. No obvious sick/COVID contact.   Patient s/p myomectomy 04/01/2019. Has not had any complications from operation. Currently working from home.      Past Medical History:  Diagnosis Date  . Abnormal Pap smear   . Abnormal uterine bleeding   . Childhood asthma   . Fibroids    uterine  . H/O hematuria    negative micros times 3    Patient Active Problem List   Diagnosis Date Noted  . Fibroid, uterine 04/01/2019  . Fibroids 03/23/2019  . Advanced maternal age in multigravida 03/04/2015  . S/P cesarean section 05/09/2014    Class: Status post  . Indication for care in labor or delivery 05/08/2014    Past Surgical History:  Procedure Laterality Date  . CESAREAN SECTION N/A 05/09/2014   Procedure: CESAREAN SECTION;  Surgeon: Darlyn Chamber, MD;  Location: [facility];  Service: Obstetrics;  Laterality: N/A;  . CESAREAN SECTION N/A 08/09/2015   Procedure: CESAREAN SECTION;  Surgeon: Everlene Farrier, MD;  Location: [facility];  Service: Obstetrics;  Laterality: N/A;  . COLPOSCOPY  2004   mild dysplasia  . CRYOTHERAPY  2004  . HYSTEROSCOPY WITH RESECTOSCOPE N/A 04/01/2019   Procedure: POSSIBLE HYSTEROSCOPY WITH RESECTOSCOPE TO REMOVE IUD;  Surgeon: Everett Graff, MD;  Location: [facility];  Service: Gynecology;  Laterality: N/A;  . IUD REMOVAL N/A 04/01/2019   Procedure: INTRAUTERINE DEVICE (IUD) REMOVAL;  Surgeon: Everett Graff, MD;  Location: [facility];  Service: Gynecology;  Laterality:  N/A;  . MYOMECTOMY N/A 04/01/2019   Procedure: ABDOMINAL MYOMECTOMY;  Surgeon: Everett Graff, MD;  Location: [facility];  Service: Gynecology;  Laterality: N/A;    OB History    Gravida  2   Para  2   Term  2   Preterm  0   AB  0   Living  2     SAB  0   TAB  0   Ectopic  0   Multiple  0   Live Births  2            Home Medications    Prior to Admission medications   Medication Sig Start Date End Date Taking? Authorizing Provider  Ascorbic Acid (VITAMIN C PO) Take 1 tablet by mouth daily.    [provider]  ECHINACEA PO Take 1 tablet by mouth daily.    [provider]  ibuprofen (ADVIL) 600 MG tablet Take 1 table po pc every 6 hours for 5 days then as needed for pain 04/02/19   Earnstine Regal, PA-C  Multiple Vitamin (MULTIVITAMIN WITH MINERALS) TABS tablet Take 1 tablet by mouth daily.    [provider]  Tetrahydrozoline HCl (VISINE OP) Place 1 drop into both eyes daily as needed (dry eyes).    [provider]    Family History Family History  Problem Relation Age of Onset  . Thyroid disease Father        ?  Marland Kitchen  Hypertension Mother   . Hypertension Brother     Social History Social History   Tobacco Use  . Smoking status: Current Every Day Smoker    Packs/day: 0.10    Years: 0.00    Pack years: 0.00    Types: Cigarettes    Last attempt to quit: 09/04/2012    Years since quitting: 6.7  . Smokeless tobacco: Never Used  Substance Use Topics  . Alcohol use: Yes    Comment: none since pregnancy  . Drug use: Not Currently    Types: Marijuana    Comment: 2017     Allergies   Amoxicillin   Review of Systems Review of Systems  Reason unable to perform ROS: See HPI as above.     Physical Exam Triage Vital Signs ED Triage Vitals  Enc Vitals Group     BP 05/19/19 1112 111/74     Pulse Rate 05/19/19 1112 85     Resp 05/19/19 1112 18     Temp 05/19/19 1112 98.4 F (36.9 C)     Temp Source 05/19/19 1112 Oral      SpO2 05/19/19 1112 96 %     Weight --      Height --      Head Circumference --      Peak Flow --      Pain Score 05/19/19 1113 0     Pain Loc --      Pain Edu? --      Excl. in [place]? --    No data found.  Updated Vital Signs BP 111/74 (BP Location: Left Arm)   Pulse 85   Temp 98.4 F (36.9 C) (Oral)   Resp 18   LMP 05/02/2019   SpO2 96%   Visual Acuity Right Eye Distance:   Left Eye Distance:   Bilateral Distance:    Right Eye Near:   Left Eye Near:    Bilateral Near:     Physical Exam Constitutional:      General: She is not in acute distress.    Appearance: Normal appearance. She is not ill-appearing, toxic-appearing or diaphoretic.  HENT:     Head: Normocephalic and atraumatic.     Mouth/Throat:     Mouth: Mucous membranes are moist.     Pharynx: Oropharynx is clear. Uvula midline.  Neck:     Musculoskeletal: Normal range of motion and neck supple.  Cardiovascular:     Rate and Rhythm: Normal rate and regular rhythm.     Heart sounds: Normal heart sounds. No murmur. No friction rub. No gallop.   Pulmonary:     Effort: Pulmonary effort is normal. No accessory muscle usage, prolonged expiration, respiratory distress or retractions.     Comments: Lungs clear to auscultation without adventitious lung sounds. Neurological:     General: No focal deficit present.     Mental Status: She is alert and oriented to person, place, and time.      UC Treatments / Results  Labs (all labs ordered are listed, but only abnormal results are displayed) Labs Reviewed  NOVEL CORONAVIRUS, NAA    EKG   Radiology No results found.  Procedures Procedures (including critical care time)  Medications Ordered in UC Medications - No data to display  Initial Impression / Assessment and Plan / UC Course  I have reviewed the triage vital signs and the nursing notes.  Pertinent labs & imaging results that were available during my care of the patient were reviewed by  me  and considered in my medical decision making (see chart for details).    COVID testing ordered. Patient to quarantine until testing results return. No alarming signs on exam.  Patient speaking in full sentences without respiratory distress.  Symptomatic treatment discussed.  Push fluids.  Return precautions given.  Patient expresses understanding and agrees to plan.  Final Clinical Impressions(s) / UC Diagnoses   Final diagnoses:  Cough   ED Prescriptions    None     PDMP not reviewed this encounter.   Ok Edwards, PA-C 05/19/19 1144

## 2019-05-19 NOTE — Discharge Instructions (Signed)
Covid testing ordred. I would like you to quarantine until testing results. You can take over the counter flonase/nasacort to help with nasal congestion/drainage. If experiencing shortness of breath, trouble breathing, go to the emergency department for further evaluation needed.

## 2019-05-23 LAB — NOVEL CORONAVIRUS, NAA: SARS-CoV-2, NAA: NOT DETECTED

## 2019-11-02 ENCOUNTER — Other Ambulatory Visit: Payer: Self-pay | Admitting: Obstetrics and Gynecology

## 2019-11-02 DIAGNOSIS — Z975 Presence of (intrauterine) contraceptive device: Secondary | ICD-10-CM

## 2019-11-03 ENCOUNTER — Other Ambulatory Visit: Payer: BC Managed Care – PPO

## 2019-11-12 ENCOUNTER — Ambulatory Visit
Admission: RE | Admit: 2019-11-12 | Discharge: 2019-11-12 | Disposition: A | Discharge status: Home / Self Care | Payer: BC Managed Care – PPO | Source: Ambulatory Visit | Attending: Obstetrics and Gynecology | Admitting: Obstetrics and Gynecology

## 2019-11-12 DIAGNOSIS — Z975 Presence of (intrauterine) contraceptive device: Secondary | ICD-10-CM

## 2021-03-20 IMAGING — US US PELVIS COMPLETE WITH TRANSVAGINAL
1 series · 13 of 25 positions shown · non-contrast
Comparison: OB ultrasound 03/22/2015

CLINICAL DATA: IUD placement. Unable to locate straining. History
of 2 prior C-sections and 1 fibroid removal. LMP 11/10/2019.

EXAM:
TRANSABDOMINAL AND TRANSVAGINAL ULTRASOUND OF PELVIS
TECHNIQUE: Both transabdominal and transvaginal ultrasound examinations of the
pelvis were performed. Transabdominal technique was performed for
global imaging of the pelvis including uterus, ovaries, adnexal
regions, and pelvic cul-de-sac. It was necessary to proceed with
endovaginal exam following the transabdominal exam to visualize the
endometrium and ovaries.

[Series 1: us pelvis complete with transvaginal · 0.25mm/px · 13 of 81 slices shown]
[im 1/81]
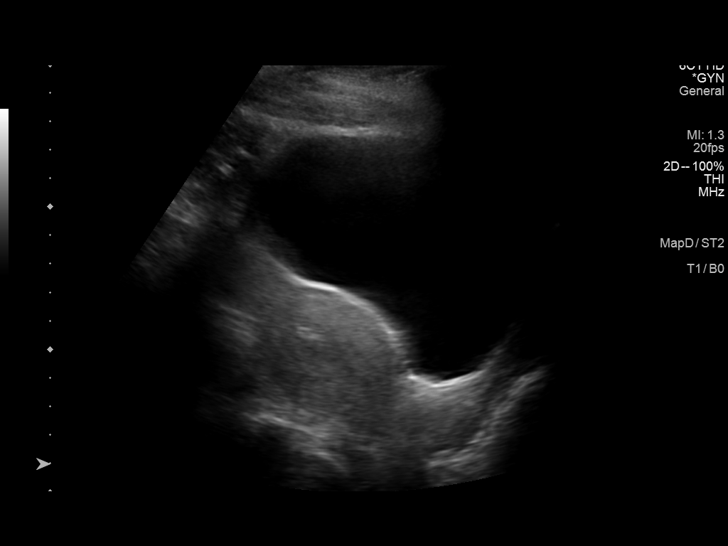
[im 7/81]
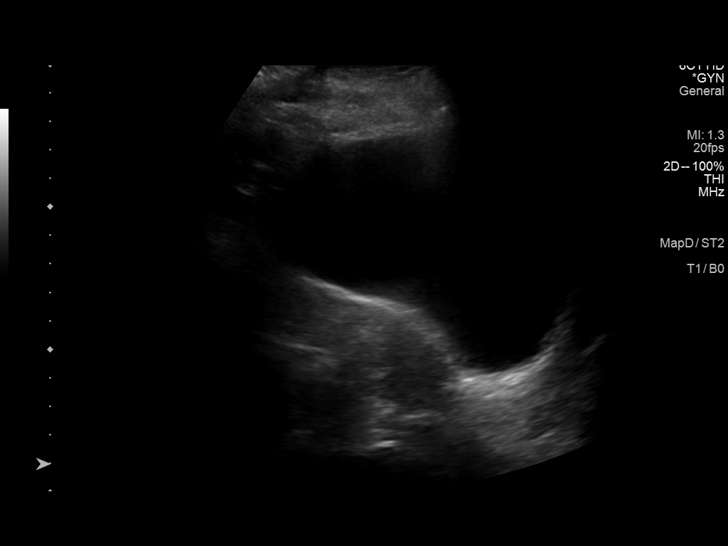
[im 14/81]
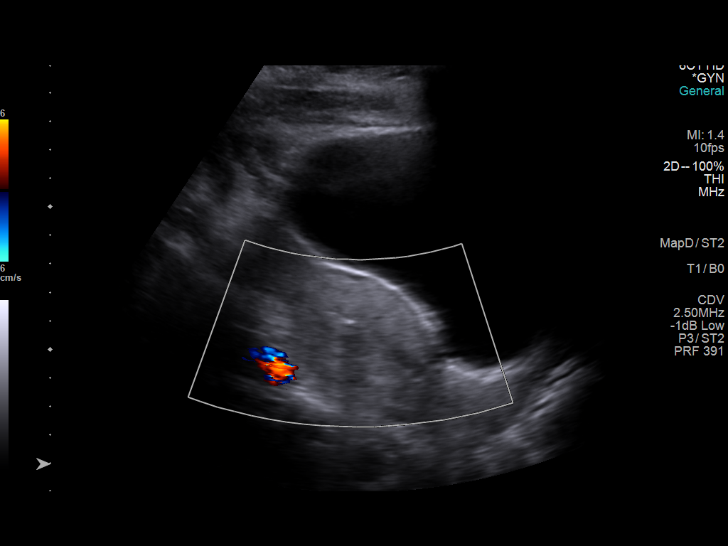
[im 21/81]
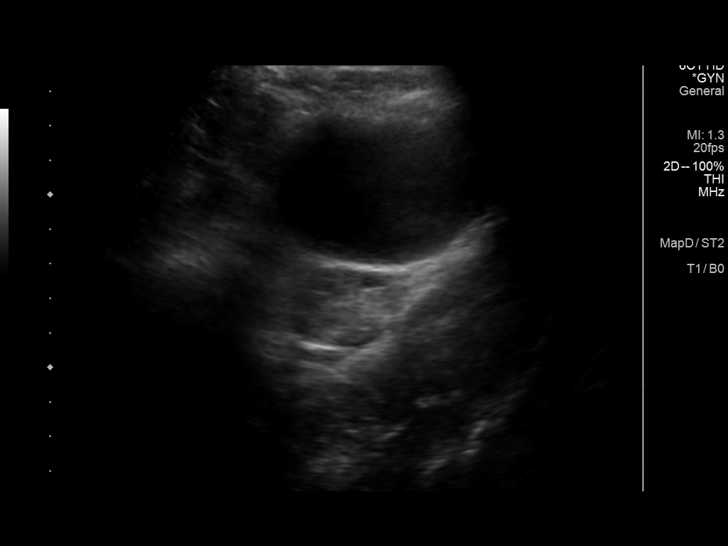
[im 27/81]
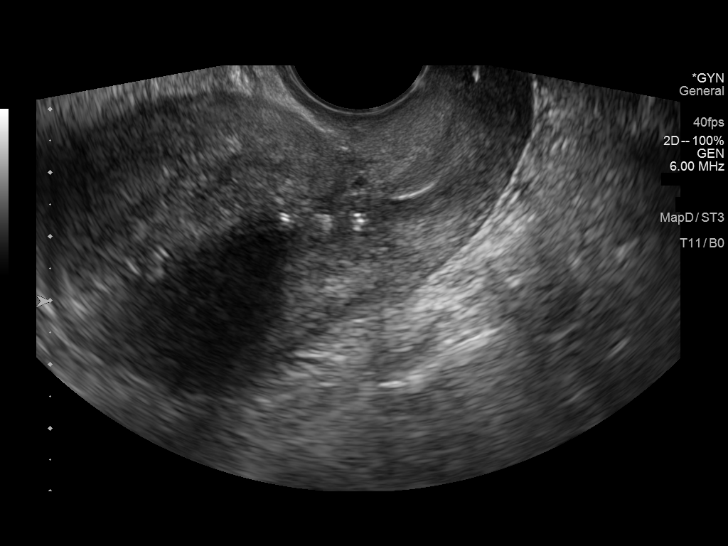
[im 34/81]
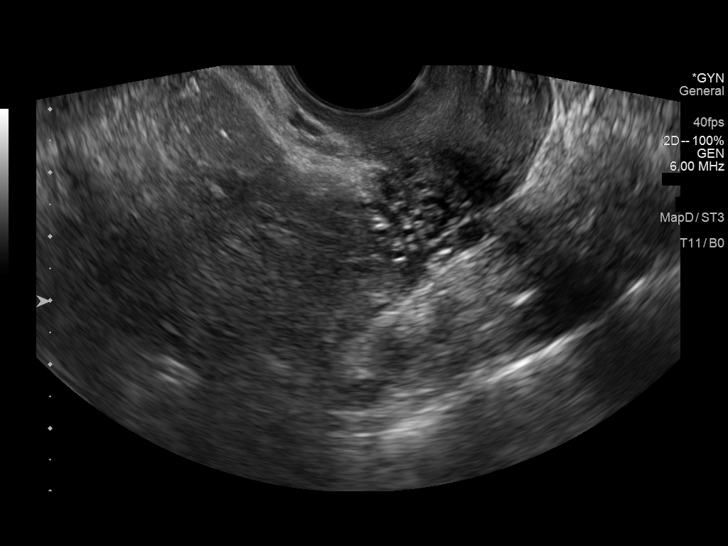
[im 41/81]
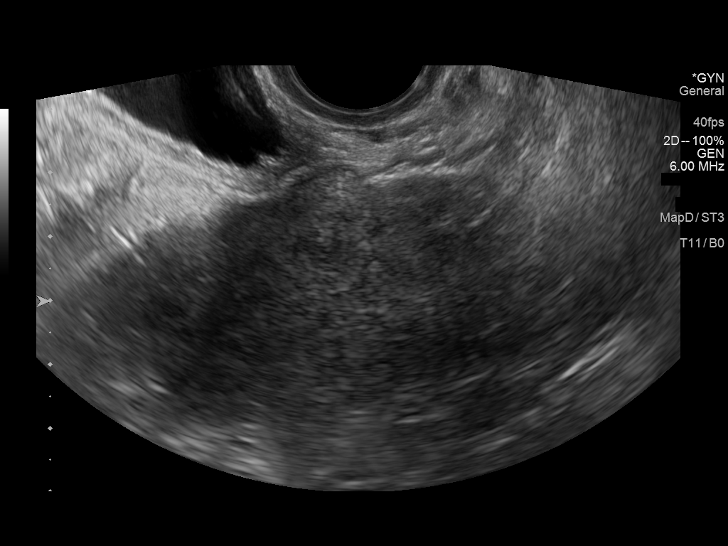
[im 47/81]
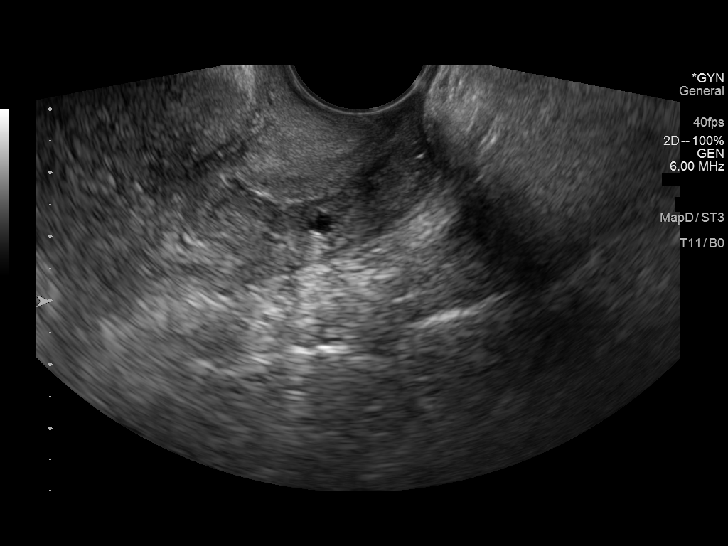
[im 54/81]
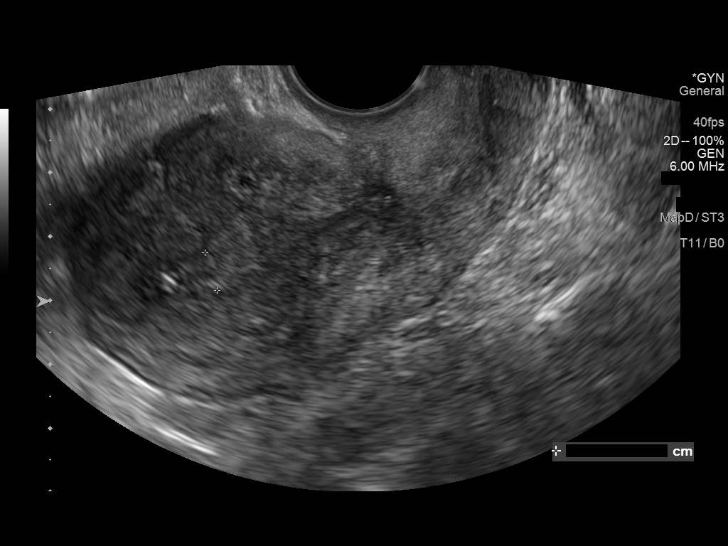
[im 61/81]
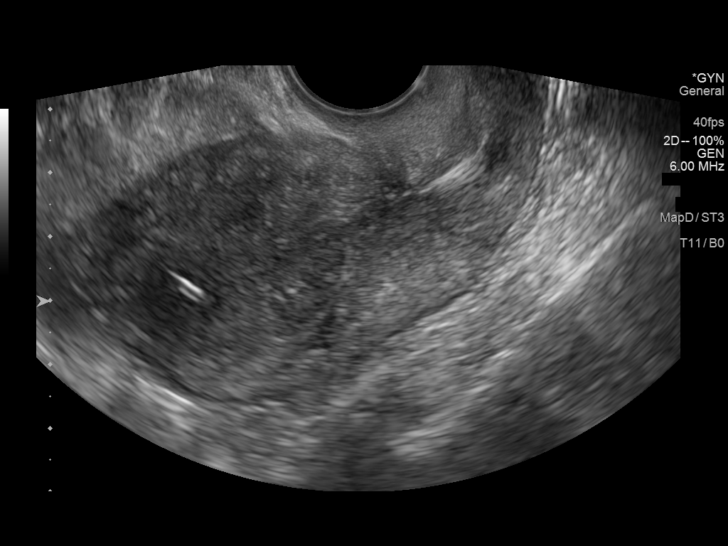
[im 67/81]
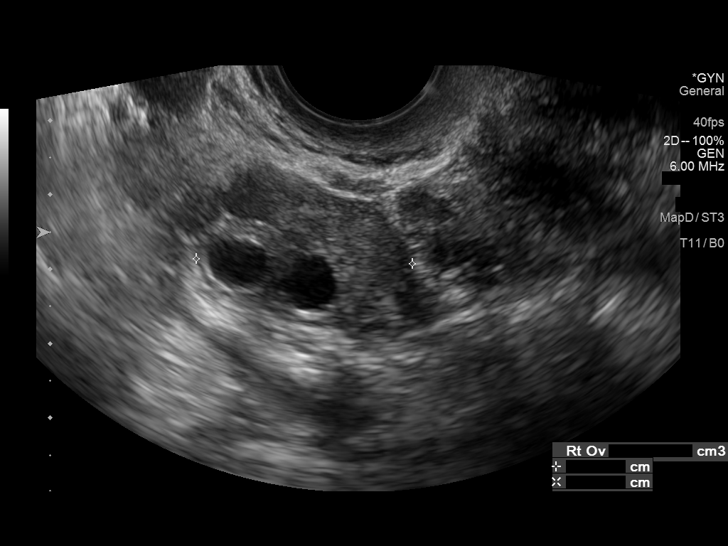
[im 74/81]
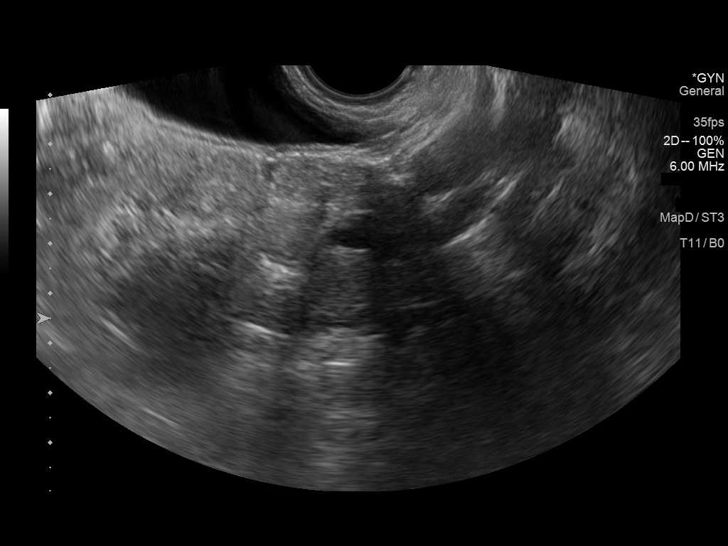
[im 81/81]
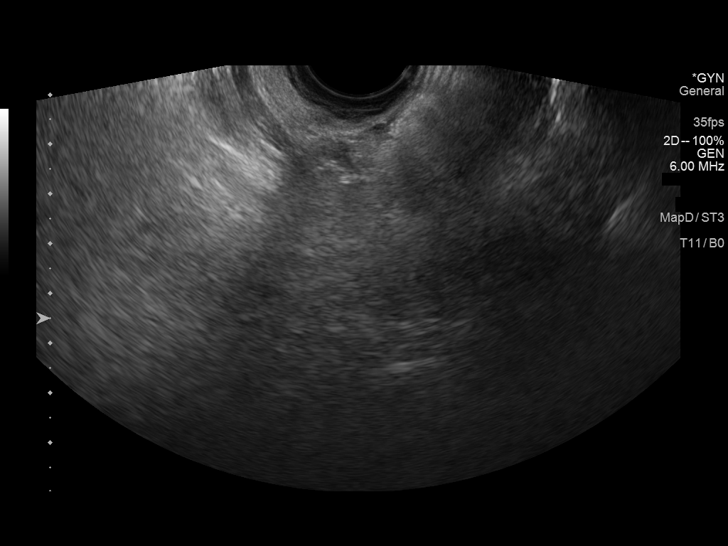

[13 of 25 positions shown; findings below may reference images not displayed]

FINDINGS: Uterus

Measurements: 8.2 x 4.1 x 4.5 centimeters = volume: 79.6 mL. Uterus
is mildly heterogeneous. Anterior C-section scar is visible.

Endometrium

Thickness: 4.2 millimeters. Intrauterine device identified in the
central canal expected.

Right ovary

Measurements: 2.4 x 2.0 x 2 970 = volume: 7.9 mL. Normal
appearance/no adnexal mass.

Left ovary

Measurements: 3.1 x 1.6 x 1.6 centimeter = volume: 4.3 mL. Normal
appearance/no adnexal mass.

Other findings

No abnormal free fluid.
IMPRESSION: 1. Appropriately positioned intrauterine device.
2. Normal appearance of the endometrium and ovaries.

## 2021-08-07 ENCOUNTER — Other Ambulatory Visit: Payer: Self-pay

## 2021-08-07 ENCOUNTER — Ambulatory Visit
Admission: EM | Admit: 2021-08-07 | Discharge: 2021-08-07 | Disposition: A | Discharge status: Home / Self Care | Payer: BC Managed Care – PPO | Attending: Physician Assistant | Admitting: Physician Assistant

## 2021-08-07 ENCOUNTER — Encounter: Payer: Self-pay | Admitting: Emergency Medicine

## 2021-08-07 DIAGNOSIS — J069 Acute upper respiratory infection, unspecified: Secondary | ICD-10-CM

## 2021-08-07 NOTE — ED Provider Notes (Signed)
[name]    CSN: [id number] Arrival date & time: 08/07/21  1710      History   Chief Complaint Chief Complaint  Patient presents with   Nasal Congestion    HPI [name] is a 41 y.o. female.   Patient here today for evaluation of nasal congestion that started today.  She states she does feel little bit achy and dizzy as well.  She denies any sore throat.  She has not had any cough.  She has not tried any treatment for symptoms.  The history is provided by the patient.   Past Medical History:  Diagnosis Date   Abnormal Pap smear    Abnormal uterine bleeding    Childhood asthma    Fibroids    uterine   H/O hematuria    negative micros times 3    Patient Active Problem List   Diagnosis Date Noted   Fibroid, uterine 04/01/2019   Fibroids 03/23/2019   Advanced maternal age in multigravida 03/04/2015   S/P cesarean section 05/09/2014    Class: Status post   Indication for care in labor or delivery 05/08/2014    Past Surgical History:  Procedure Laterality Date   CESAREAN SECTION N/A 05/09/2014   Procedure: CESAREAN SECTION;  Surgeon: Darlyn Chamber, MD;  Location: [facility];  Service: Obstetrics;  Laterality: N/A;   CESAREAN SECTION N/A 08/09/2015   Procedure: CESAREAN SECTION;  Surgeon: Everlene Farrier, MD;  Location: [facility];  Service: Obstetrics;  Laterality: N/A;   COLPOSCOPY  2004   mild dysplasia   CRYOTHERAPY  2004   HYSTEROSCOPY WITH RESECTOSCOPE N/A 04/01/2019   Procedure: POSSIBLE HYSTEROSCOPY WITH RESECTOSCOPE TO REMOVE IUD;  Surgeon: Everett Graff, MD;  Location: [facility];  Service: Gynecology;  Laterality: N/A;   IUD REMOVAL N/A 04/01/2019   Procedure: INTRAUTERINE DEVICE (IUD) REMOVAL;  Surgeon: Everett Graff, MD;  Location: [facility];  Service: Gynecology;  Laterality: N/A;   MYOMECTOMY N/A 04/01/2019   Procedure: ABDOMINAL MYOMECTOMY;  Surgeon: Everett Graff, MD;  Location: [facility];  Service: Gynecology;  Laterality: N/A;    OB History      Gravida  2   Para  2   Term  2   Preterm  0   AB  0   Living  2      SAB  0   IAB  0   Ectopic  0   Multiple  0   Live Births  2            Home Medications    Prior to Admission medications   Medication Sig Start Date End Date Taking? Authorizing Provider  Ascorbic Acid (VITAMIN C PO) Take 1 tablet by mouth daily.    [provider]  ECHINACEA PO Take 1 tablet by mouth daily.    [provider]  ibuprofen (ADVIL) 600 MG tablet Take 1 table po pc every 6 hours for 5 days then as needed for pain 04/02/19   Earnstine Regal, PA-C  Multiple Vitamin (MULTIVITAMIN WITH MINERALS) TABS tablet Take 1 tablet by mouth daily.    [provider]  Tetrahydrozoline HCl (VISINE OP) Place 1 drop into both eyes daily as needed (dry eyes).    [provider]    Family History Family History  Problem Relation Age of Onset   Hypertension Mother    Thyroid disease Father        ?   Hypertension Brother     Social History  Social History   Tobacco Use   Smoking status: Every Day    Packs/day: 0.10    Years: 0.00    Pack years: 0.00    Types: Cigarettes    Last attempt to quit: 09/04/2012    Years since quitting: 8.9   Smokeless tobacco: Never  Vaping Use   Vaping Use: Never used  Substance Use Topics   Alcohol use: Yes    Comment: none since pregnancy   Drug use: Not Currently    Types: Marijuana    Comment: 2017     Allergies   Amoxicillin   Review of Systems Review of Systems  Constitutional:  Negative for chills and fever.  HENT:  Positive for congestion. Negative for ear pain and sore throat.   Eyes:  Negative for discharge and redness.  Respiratory:  Negative for cough, shortness of breath and wheezing.   Gastrointestinal:  Negative for abdominal pain, diarrhea, nausea and vomiting.  Musculoskeletal:  Positive for myalgias.    Physical Exam Triage Vital Signs ED Triage Vitals  Enc Vitals Group     BP 08/07/21  1728 124/78     Pulse Rate 08/07/21 1728 (!) 101     Resp 08/07/21 1728 16     Temp 08/07/21 1728 98.1 F (36.7 C)     Temp Source 08/07/21 1728 Oral     SpO2 08/07/21 1728 97 %     Weight --      Height --      Head Circumference --      Peak Flow --      Pain Score 08/07/21 1727 0     Pain Loc --      Pain Edu? --      Excl. in [place]? --    No data found.  Updated Vital Signs BP 124/78 (BP Location: Left Arm)    Pulse (!) 101    Temp 98.1 F (36.7 C) (Oral)    Resp 16    SpO2 97%      Physical Exam Vitals and nursing note reviewed.  Constitutional:      General: She is not in acute distress.    Appearance: Normal appearance. She is not ill-appearing.  HENT:     Head: Normocephalic and atraumatic.     Nose: Congestion (mild) present.     Mouth/Throat:     Mouth: Mucous membranes are moist.     Pharynx: No oropharyngeal exudate or posterior oropharyngeal erythema.  Eyes:     Conjunctiva/sclera: Conjunctivae normal.  Cardiovascular:     Rate and Rhythm: Normal rate and regular rhythm.     Heart sounds: Normal heart sounds. No murmur heard. Pulmonary:     Effort: Pulmonary effort is normal. No respiratory distress.     Breath sounds: Normal breath sounds. No wheezing, rhonchi or rales.  Skin:    General: Skin is warm and dry.  Neurological:     Mental Status: She is alert.  Psychiatric:        Mood and Affect: Mood normal.        Thought Content: Thought content normal.     UC Treatments / Results  Labs (all labs ordered are listed, but only abnormal results are displayed) Labs Reviewed  COVID-19, FLU A+B NAA    EKG   Radiology No results found.  Procedures Procedures (including critical care time)  Medications Ordered in UC Medications - No data to display  Initial Impression / Assessment and Plan / UC Course  I have reviewed the triage vital signs and the nursing notes.  Pertinent labs & imaging results that were available during my care of the  patient were reviewed by me and considered in my medical decision making (see chart for details).    Suspect viral etiology of symptoms.  Will screen for COVID and flu.  Will await results further recommendation.  Encouraged follow-up with any further concerns.  Final Clinical Impressions(s) / UC Diagnoses   Final diagnoses:  Acute upper respiratory infection   Discharge Instructions   None    ED Prescriptions   None    PDMP not reviewed this encounter.   Francene Finders, PA-C 08/07/21 1814

## 2021-08-07 NOTE — ED Triage Notes (Signed)
Nasal congestion starting today and feeling a little achy and dizzy

## 2021-08-08 LAB — COVID-19, FLU A+B NAA
Influenza A, NAA: NOT DETECTED
Influenza B, NAA: NOT DETECTED
SARS-CoV-2, NAA: DETECTED — AB

## 2024-05-11 ENCOUNTER — Other Ambulatory Visit: Payer: Self-pay

## 2024-05-11 ENCOUNTER — Encounter (HOSPITAL_BASED_OUTPATIENT_CLINIC_OR_DEPARTMENT_OTHER): Payer: Self-pay

## 2024-05-11 ENCOUNTER — Emergency Department (HOSPITAL_BASED_OUTPATIENT_CLINIC_OR_DEPARTMENT_OTHER)
Admission: EM | Admit: 2024-05-11 | Discharge: 2024-05-11 | Disposition: A | Discharge status: Home / Self Care | Attending: Emergency Medicine | Admitting: Emergency Medicine

## 2024-05-11 DIAGNOSIS — Y92410 Unspecified street and highway as the place of occurrence of the external cause: Secondary | ICD-10-CM | POA: Insufficient documentation

## 2024-05-11 DIAGNOSIS — M25512 Pain in left shoulder: Secondary | ICD-10-CM | POA: Insufficient documentation

## 2024-05-11 DIAGNOSIS — R519 Headache, unspecified: Secondary | ICD-10-CM | POA: Insufficient documentation

## 2024-05-11 DIAGNOSIS — M791 Myalgia, unspecified site: Secondary | ICD-10-CM | POA: Diagnosis present

## 2024-05-11 MED ORDER — METHOCARBAMOL 500 MG PO TABS: 500.0000 mg | ORAL_TABLET | Freq: Two times a day (BID) | ORAL | 0 refills | Status: AC | PRN

## 2024-05-11 NOTE — ED Notes (Signed)
 Pt resting comfortably in room, respirations even and unlabored. Pt provided discharge instructions at this time. Medications and pain management discussed. Pt verbalized understanding, no further questions at this time.
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
